# Patient Record
Sex: Female | Born: 1935 | Race: White | Hispanic: No | Smoking: Never smoker
Health system: Southern US, Community
[De-identification: ages and names within clinical notes are randomized; demographics above are authoritative.]

## PROBLEM LIST (undated history)

## (undated) DIAGNOSIS — F0390 Unspecified dementia without behavioral disturbance: Secondary | ICD-10-CM

## (undated) DIAGNOSIS — H3530 Unspecified macular degeneration: Secondary | ICD-10-CM

## (undated) DIAGNOSIS — F0280 Dementia in other diseases classified elsewhere without behavioral disturbance: Secondary | ICD-10-CM

## (undated) DIAGNOSIS — G309 Alzheimer's disease, unspecified: Secondary | ICD-10-CM

---

## 2013-07-10 ENCOUNTER — Ambulatory Visit: Payer: Self-pay

## 2014-09-27 ENCOUNTER — Encounter: Payer: Self-pay | Admitting: Emergency Medicine

## 2014-09-27 ENCOUNTER — Emergency Department
Admission: EM | Admit: 2014-09-27 | Discharge: 2014-09-27 | Disposition: A | Discharge status: Home / Self Care | Payer: Medicare Other | Attending: Emergency Medicine | Admitting: Emergency Medicine

## 2014-09-27 DIAGNOSIS — Z008 Encounter for other general examination: Secondary | ICD-10-CM | POA: Diagnosis present

## 2014-09-27 DIAGNOSIS — F0280 Dementia in other diseases classified elsewhere without behavioral disturbance: Secondary | ICD-10-CM | POA: Diagnosis not present

## 2014-09-27 DIAGNOSIS — G309 Alzheimer's disease, unspecified: Secondary | ICD-10-CM | POA: Insufficient documentation

## 2014-09-27 DIAGNOSIS — Z0000 Encounter for general adult medical examination without abnormal findings: Secondary | ICD-10-CM

## 2014-09-27 HISTORY — DX: Unspecified dementia, unspecified severity, without behavioral disturbance, psychotic disturbance, mood disturbance, and anxiety: F03.90

## 2014-09-27 MED ORDER — HYDROMORPHONE HCL 1 MG/ML IJ SOLN
INTRAMUSCULAR | Status: AC
  Filled 2014-09-27: qty 1
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
## 2014-09-27 NOTE — Discharge Instructions (Signed)
Medical Screening Exam A medical screening exam has been done. This exam helps find the cause of your problem and determines whether you need emergency treatment. Your exam has shown that you do not need emergency treatment at this point. It is safe for you to go to your caregiver's office or clinic for treatment. You should make an appointment today to see your caregiver as soon as he or she is available. Depending on your illness, your symptoms and condition can change over time. If your condition gets worse or you develop new or troubling symptoms before you see your caregiver, you should return to the emergency department for further evaluation.  Document Released: 02/08/2004 Document Revised: 03/25/2011 Document Reviewed: 09/19/2010 [facility] Patient Information 2015 [place], [place]. This information is not intended to replace advice given to you by your health care provider. Make sure you discuss any questions you have with your health care provider.  Suggest further follow-up with patient's primary care physician if needed. Her Lorazepam can also be used if the patient becomes agitated if needed

## 2014-09-27 NOTE — ED Notes (Signed)
Pt discharged to nursing home with written discharge instructions for facility; nad noted.

## 2015-05-29 ENCOUNTER — Encounter: Payer: Self-pay | Admitting: Emergency Medicine

## 2015-05-29 ENCOUNTER — Emergency Department: Payer: Medicare Other

## 2015-05-29 ENCOUNTER — Observation Stay: Payer: Medicare Other

## 2015-05-29 ENCOUNTER — Observation Stay
Admission: EM | Admit: 2015-05-29 | Discharge: 2015-05-30 | Payer: Medicare Other | Attending: Internal Medicine | Admitting: Internal Medicine

## 2015-05-29 DIAGNOSIS — G309 Alzheimer's disease, unspecified: Secondary | ICD-10-CM | POA: Diagnosis not present

## 2015-05-29 DIAGNOSIS — F809 Developmental disorder of speech and language, unspecified: Secondary | ICD-10-CM

## 2015-05-29 DIAGNOSIS — R4781 Slurred speech: Principal | ICD-10-CM | POA: Insufficient documentation

## 2015-05-29 DIAGNOSIS — F0390 Unspecified dementia without behavioral disturbance: Secondary | ICD-10-CM

## 2015-05-29 DIAGNOSIS — Z7982 Long term (current) use of aspirin: Secondary | ICD-10-CM | POA: Insufficient documentation

## 2015-05-29 DIAGNOSIS — I639 Cerebral infarction, unspecified: Secondary | ICD-10-CM

## 2015-05-29 DIAGNOSIS — Z66 Do not resuscitate: Secondary | ICD-10-CM | POA: Diagnosis not present

## 2015-05-29 DIAGNOSIS — R451 Restlessness and agitation: Secondary | ICD-10-CM | POA: Diagnosis not present

## 2015-05-29 DIAGNOSIS — Z8673 Personal history of transient ischemic attack (TIA), and cerebral infarction without residual deficits: Secondary | ICD-10-CM | POA: Insufficient documentation

## 2015-05-29 DIAGNOSIS — I4891 Unspecified atrial fibrillation: Secondary | ICD-10-CM

## 2015-05-29 DIAGNOSIS — R4701 Aphasia: Secondary | ICD-10-CM

## 2015-05-29 DIAGNOSIS — F0280 Dementia in other diseases classified elsewhere without behavioral disturbance: Secondary | ICD-10-CM | POA: Insufficient documentation

## 2015-05-29 DIAGNOSIS — H3530 Unspecified macular degeneration: Secondary | ICD-10-CM | POA: Diagnosis not present

## 2015-05-29 HISTORY — DX: Dementia in other diseases classified elsewhere, unspecified severity, without behavioral disturbance, psychotic disturbance, mood disturbance, and anxiety: F02.80

## 2015-05-29 HISTORY — DX: Alzheimer's disease, unspecified: G30.9

## 2015-05-29 HISTORY — DX: Unspecified macular degeneration: H35.30

## 2015-05-29 LAB — COMPREHENSIVE METABOLIC PANEL
ALT: 15 U/L (ref 14–54)
AST: 23 U/L (ref 15–41)
Albumin: 3.7 g/dL (ref 3.5–5.0)
Alkaline Phosphatase: 75 U/L (ref 38–126)
Anion gap: 5 (ref 5–15)
BILIRUBIN TOTAL: 0.6 mg/dL (ref 0.3–1.2)
BUN: 19 mg/dL (ref 6–20)
CO2: 31 mmol/L (ref 22–32)
Calcium: 8.8 mg/dL — ABNORMAL LOW (ref 8.9–10.3)
Chloride: 105 mmol/L (ref 101–111)
Creatinine, Ser: 0.82 mg/dL (ref 0.44–1.00)
GFR calc Af Amer: >60 mL/min (ref >60)
Glucose, Bld: 89 mg/dL (ref 65–99)
POTASSIUM: 4.4 mmol/L (ref 3.5–5.1)
Sodium: 141 mmol/L (ref 135–145)
TOTAL PROTEIN: 6.8 g/dL (ref 6.5–8.1)

## 2015-05-29 LAB — DIFFERENTIAL
BASOS PCT: 1 %
Basophils Absolute: 0 10*3/uL (ref 0–0.1)
EOS ABS: 0 10*3/uL (ref 0–0.7)
EOS PCT: 1 %
Lymphocytes Relative: 17 %
Lymphs Abs: 1.1 10*3/uL (ref 1.0–3.6)
MONO ABS: 0.5 10*3/uL (ref 0.2–0.9)
Monocytes Relative: 8 %
NEUTROS ABS: 4.5 10*3/uL (ref 1.4–6.5)
Neutrophils Relative %: 73 %

## 2015-05-29 LAB — URINALYSIS COMPLETE WITH MICROSCOPIC (ARMC ONLY)
BILIRUBIN URINE: NEGATIVE
Bacteria, UA: NONE SEEN
GLUCOSE, UA: NEGATIVE mg/dL
Hgb urine dipstick: NEGATIVE
KETONES UR: NEGATIVE mg/dL
Leukocytes, UA: NEGATIVE
Nitrite: NEGATIVE
Protein, ur: NEGATIVE mg/dL
SPECIFIC GRAVITY, URINE: 1.014 (ref 1.005–1.030)
Squamous Epithelial / LPF: NONE SEEN
pH: 7 (ref 5.0–8.0)

## 2015-05-29 LAB — TROPONIN I: Troponin I: <0.03 ng/mL (ref <0.031)

## 2015-05-29 LAB — CBC
HEMATOCRIT: 37 % (ref 35.0–47.0)
Hemoglobin: 12.1 g/dL (ref 12.0–16.0)
MCH: 27.5 pg (ref 26.0–34.0)
MCHC: 32.8 g/dL (ref 32.0–36.0)
MCV: 83.8 fL (ref 80.0–100.0)
Platelets: 193 10*3/uL (ref 150–440)
RBC: 4.41 MIL/uL (ref 3.80–5.20)
RDW: 15 % — AB (ref 11.5–14.5)
WBC: 6.2 10*3/uL (ref 3.6–11.0)

## 2015-05-29 LAB — APTT: aPTT: 26 seconds (ref 24–36)

## 2015-05-29 LAB — LIPID PANEL
Cholesterol: 180 mg/dL (ref 0–200)
HDL: 67 mg/dL (ref >40)
LDL CALC: 104 mg/dL — AB (ref 0–99)
Total CHOL/HDL Ratio: 2.7 RATIO
Triglycerides: 47 mg/dL (ref <150)
VLDL: 9 mg/dL (ref 0–40)

## 2015-05-29 LAB — PROTIME-INR
INR: 1.02
Prothrombin Time: 13.6 seconds (ref 11.4–15.0)

## 2015-05-29 MED ORDER — LORAZEPAM 2 MG/ML IJ SOLN
0.2500 mg | Freq: Once | INTRAMUSCULAR | Status: AC
  Administered 2015-05-29: 0.25 mg via INTRAVENOUS
  Filled 2015-05-29: qty 1

## 2015-05-29 MED ORDER — OCUVITE-LUTEIN PO CAPS
1.0000 | ORAL_CAPSULE | Freq: Every day | ORAL | Status: DC
  Filled 2015-05-29 (×2): qty 1

## 2015-05-29 MED ORDER — IPRATROPIUM BROMIDE 0.03 % NA SOLN
2.0000 | Freq: Three times a day (TID) | NASAL | Status: DC
Start: 2015-05-29 — End: 2015-05-30
  Filled 2015-05-29: qty 30

## 2015-05-29 MED ORDER — LORATADINE 10 MG PO TABS: 10.0000 mg | ORAL_TABLET | Freq: Every day | ORAL | Status: DC

## 2015-05-29 MED ORDER — LORAZEPAM 0.5 MG PO TABS: 0.2500 mg | ORAL_TABLET | Freq: Three times a day (TID) | ORAL | Status: DC | PRN

## 2015-05-29 MED ORDER — ATORVASTATIN CALCIUM 20 MG PO TABS: 40.0000 mg | ORAL_TABLET | Freq: Every day | ORAL | Status: DC

## 2015-05-29 MED ORDER — ASPIRIN 325 MG PO TABS: 325.0000 mg | ORAL_TABLET | Freq: Every day | ORAL | Status: DC

## 2015-05-29 MED ORDER — LORAZEPAM 0.5 MG PO TABS: 0.2500 mg | ORAL_TABLET | Freq: Once | ORAL | Status: DC

## 2015-05-29 MED ORDER — SODIUM CHLORIDE 0.9% FLUSH
3.0000 mL | Freq: Two times a day (BID) | INTRAVENOUS | Status: DC
Start: 2015-05-29 — End: 2015-05-30
  Administered 2015-05-29 – 2015-05-30 (×2): 3 mL via INTRAVENOUS

## 2015-05-29 MED ORDER — ASPIRIN 81 MG PO CHEW
81.0000 mg | CHEWABLE_TABLET | Freq: Once | ORAL | Status: DC
  Filled 2015-05-29: qty 1

## 2015-05-29 MED ORDER — DONEPEZIL HCL 5 MG PO TABS: 10.0000 mg | ORAL_TABLET | Freq: Every day | ORAL | Status: DC

## 2015-05-29 MED ORDER — LORAZEPAM 0.5 MG PO TABS
ORAL_TABLET | ORAL | Status: AC
  Filled 2015-05-29: qty 1

## 2015-05-29 MED ORDER — VITAMIN C 500 MG PO TABS: 500.0000 mg | ORAL_TABLET | Freq: Every day | ORAL | Status: DC

## 2015-05-29 MED ORDER — HEPARIN SODIUM (PORCINE) 5000 UNIT/ML IJ SOLN
5000.0000 [IU] | Freq: Three times a day (TID) | INTRAMUSCULAR | Status: DC
  Administered 2015-05-29 – 2015-05-30 (×2): 5000 [IU] via SUBCUTANEOUS
  Filled 2015-05-29 (×2): qty 1

## 2015-05-29 MED ORDER — BACITRACIN-NEOMYCIN-POLYMYXIN 400-5-5000 EX OINT
1.0000 "application " | TOPICAL_OINTMENT | Freq: Every day | CUTANEOUS | Status: DC
  Filled 2015-05-29 (×2): qty 1

## 2015-05-29 NOTE — H&P (Signed)
[employer] [facility] [place] [facility]   PATIENT NAME: Kathleen Chandler    MR#:  [id number]  DATE OF BIRTH:  30-Mar-1932  DATE OF ADMISSION:  05/29/2015  PRIMARY CARE PHYSICIAN: No primary care provider on file.   REQUESTING/REFERRING PHYSICIAN: Governor Rooks  CHIEF COMPLAINT:   Chief Complaint  Patient presents with  . Aphasia    HISTORY OF PRESENT ILLNESS: Kathleen Chandler  is a 82 y.o. female with a known history of dementia, NH resident- ahd slurred speech since last night-so sent here. Pt is completely confused, and walking in room and opening drawers, and checking all the storages, also walked in lobby. She is not able to communicate well, speaks few words. Moves all 4 limbs. I called daughter and left a msg, to get more info about the pt.  PAST MEDICAL HISTORY:   Past Medical History  Diagnosis Date  . Dementia   . Alzheimer's dementia   . Macular degeneration     PAST SURGICAL HISTORY: History reviewed. No pertinent past surgical history.  SOCIAL HISTORY:  Social History  Substance Use Topics  . Smoking status: Never Smoker   . Smokeless tobacco: Not on file  . Alcohol Use: No    FAMILY HISTORY:  Family History  Problem Relation Age of Onset  . Family history unknown: Yes   Pt is not able to give more details, and daughter did not answer the phone so far.  DRUG ALLERGIES: No Known Allergies  REVIEW OF SYSTEMS:   Pt have confusion.  MEDICATIONS AT HOME:  Prior to Admission medications   Medication Sig Start Date End Date Taking? Authorizing Provider  aspirin 325 MG tablet Take 325 mg by mouth daily.   Yes Historical Provider, MD  cetirizine (ZYRTEC) 10 MG tablet Take 10 mg by mouth daily as needed for allergies.   Yes Historical Provider, MD  donepezil (ARICEPT) 10 MG tablet Take 10 mg by mouth at bedtime.   Yes Historical Provider, MD  ipratropium (ATROVENT) 0.03 % nasal spray Place 2 sprays into both nostrils 3 (three) times daily.   Yes Historical  Provider, MD  LORazepam (ATIVAN) 0.5 MG tablet Take 0.25 mg by mouth every 8 (eight) hours as needed for anxiety.   Yes Historical Provider, MD  Multiple Vitamins-Minerals (ICAPS) TABS Take 1 tablet by mouth daily.   Yes Historical Provider, MD  neomycin-bacitracin-polymyxin (NEOSPORIN) [phone] ointment Apply 1 application topically daily. Pt applies to bilateral legs.   Yes Historical Provider, MD  vitamin C (ASCORBIC ACID) 500 MG tablet Take 500 mg by mouth daily.   Yes Historical Provider, MD      PHYSICAL EXAMINATION:   VITAL SIGNS: Blood pressure 150/89, pulse 100, temperature 97.7 F (36.5 C), temperature source Oral, resp. rate 24, weight 57.471 kg (126 lb 11.2 oz), SpO2 100 %.  GENERAL:  82 y.o.-year-old patient walking in room with no acute distress.  EYES: Pupils equal, round, reactive to light and accommodation. No scleral icterus. Extraocular muscles intact.  HEENT: Head atraumatic, normocephalic. Oropharynx and nasopharynx clear.  NECK:  Supple, no jugular venous distention. No thyroid enlargement, no tenderness.  LUNGS: Normal breath sounds bilaterally, no wheezing, rales,rhonchi or crepitation. No use of accessory muscles of respiration.  CARDIOVASCULAR: S1, S2 normal. No murmurs, rubs, or gallops.  ABDOMEN: Soft, nontender, nondistended. Bowel sounds present. No organomegaly or mass.  EXTREMITIES: No pedal edema, cyanosis, or clubbing.  NEUROLOGIC: Cranial nerves II through XII are intact. Muscle strength 5/5 in all extremities. Sensation  intact. Speech is with just few words only, and not clear. i am not sure about her baseline. PSYCHIATRIC: The patient is alert and not oriented .  SKIN: No obvious rash, lesion, or ulcer.   LABORATORY PANEL:   CBC  Recent Labs Lab 05/29/15 1013  WBC 6.2  HGB 12.1  HCT 37.0  PLT 193  MCV 83.8  MCH 27.5  MCHC 32.8  RDW 15.0*  LYMPHSABS 1.1  MONOABS 0.5  EOSABS 0.0  BASOSABS 0.0    ------------------------------------------------------------------------------------------------------------------  Chemistries   Recent Labs Lab 05/29/15 1013  NA 141  K 4.4  CL 105  CO2 31  GLUCOSE 89  BUN 19  CREATININE 0.82  CALCIUM 8.8*  AST 23  ALT 15  ALKPHOS 75  BILITOT 0.6   ------------------------------------------------------------------------------------------------------------------ CrCl cannot be calculated (Unknown ideal weight.). ------------------------------------------------------------------------------------------------------------------ No results for input(s): TSH, T4TOTAL, T3FREE, THYROIDAB in the last 72 hours.  Invalid input(s): FREET3   Coagulation profile  Recent Labs Lab 05/29/15 1013  INR 1.02   ------------------------------------------------------------------------------------------------------------------- No results for input(s): DDIMER in the last 72 hours. -------------------------------------------------------------------------------------------------------------------  Cardiac Enzymes  Recent Labs Lab 05/29/15 1013  TROPONINI <0.03   ------------------------------------------------------------------------------------------------------------------ Invalid input(s): POCBNP  ---------------------------------------------------------------------------------------------------------------  Urinalysis    Component Value Date/Time   COLORURINE YELLOW* 05/29/2015 1237   APPEARANCEUR CLEAR* 05/29/2015 1237   LABSPEC 1.014 05/29/2015 1237   PHURINE 7.0 05/29/2015 1237   GLUCOSEU NEGATIVE 05/29/2015 1237   HGBUR NEGATIVE 05/29/2015 1237   BILIRUBINUR NEGATIVE 05/29/2015 1237   KETONESUR NEGATIVE 05/29/2015 1237   PROTEINUR NEGATIVE 05/29/2015 1237   NITRITE NEGATIVE 05/29/2015 1237   LEUKOCYTESUR NEGATIVE 05/29/2015 1237     RADIOLOGY: Ct Head Wo Contrast  05/29/2015  CLINICAL DATA:  Slurred speech.  History of  dementia. EXAM: CT HEAD WITHOUT CONTRAST TECHNIQUE: Contiguous axial images were obtained from the base of the skull through the vertex without intravenous contrast. COMPARISON:  None. FINDINGS: There is a focal low-density along the inferior left temporal lobe suggestive for an old infarct. There is ex vacuo dilatation of the left temporal horn. Mild low density in the posterior periventricular white matter suggestive for chronic changes. Small low-density area in the left basal ganglia region could represent a dilated perivascular space versus old lacune. No evidence for acute hemorrhage, mass lesion, midline shift, hydrocephalus or large acute/subacute infarct. Wispy material in the sphenoid sinuses. No calvarial fracture. IMPRESSION: No acute intracranial abnormality. 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 Dilated perivascular space versus small lacune infarct of unknown age in the left basal ganglia region. Electronically Signed   By: Richarda Overlie M.D.   On: 05/29/2015 10:51    EKG: A fib, seems new.  IMPRESSION AND PLAN:  * Slurred speech   May be a stroke.   MRI, Carotid doppler, Echo.   Pt is walking in room, so may not need PT.   She is already a NH candidate duet o dementia.   Check Lipid and Hb A1c.  * New A fib   I will not give anticoagulant due to old age and dementia.   Cont asa. Check echo and serial troponin.  * Dementia   Cont home meds.   All the records are reviewed and case discussed with ED provider. Management plans discussed with the patient, family and they are in agreement.  CODE STATUS: full. Need to confirm with her daughter. Code Status History    This patient  does not have a recorded code status. Please follow your organizational policy for patients in this situation.       TOTAL TIME TAKING CARE OF THIS PATIENT: 45 minutes.    Altamese DillingVACHHANI, Shandrell Boda M.D on 05/29/2015   Between 7am  to 6pm - Pager - [phone]  After 6pm go to [email or web address] - Social research officer, governmentpassword [email or web address] [email or web address]  [facility]  Office  [phone]  CC: Primary care physician; No primary care provider on file.   Note: This dictation was prepared with Dragon dictation along with smaller phrase technology. Any transcriptional errors that result from this process are unintentional.

## 2015-05-29 NOTE — ED Notes (Signed)
Staff unable to execute ultrasound on patient. Patient refuses to lay on stretcher, uncooperative and agitated. Will make ordering MD aware.

## 2015-05-29 NOTE — ED Notes (Signed)
MD approved administration of aspirin PO despite inability to perform swallow screen.  Attempt to give ordered aspirin with apples sauce to patient was unsuccessful.  Pt is restless and easily agitated.

## 2015-05-29 NOTE — Care Management Obs Status (Signed)
MEDICARE OBSERVATION STATUS NOTIFICATION   Patient Details  Name: Kathleen Chandler MRN: [id number] Date of Birth: 07/23/1932   Medicare Observation Status Notification Given:  Yes   Explained notice to patient's daughter Steward DroneBrenda over the phone.  (patient being placed in observation to rule out for CVA). Initial work up is negative.  Primary nurse will five the [facility].  Copy placed in the medical record. Provided CM phone number for any questions regarding the notice.      Eber HongGreene, Jacquel Mccamish R, RN 05/29/2015, 5:09 PM

## 2015-05-29 NOTE — ED Notes (Signed)
1:1 sitter at bedside for safety.

## 2015-05-29 NOTE — ED Notes (Signed)
Sitter present with patient  

## 2015-05-29 NOTE — ED Notes (Signed)
[facility] 8pm last night. Pt with hx of dementia, confused upon arrival, tearful. Pt with slurred speech, no other obvious deficits noted.

## 2015-05-29 NOTE — ED Provider Notes (Signed)
[facility] Emergency Department Provider Note   ____________________________________________  Time seen: Approximately 11:30am I have reviewed the triage vital signs and the triage nursing note.  HISTORY  Chief Complaint Aphasia   Historian Limited - Dementia patient Nursing home report  HPI Kathleen Chandler is a 82 y.o. female with a history dementia and was sent here from the nursing home after reportedly being witnessed to have slurred speech last night. Reportedly the daughter from out of town requested the patient not be sent out last night, and it sounds like wanted the patient be evaluated this morning.  Per the daughter, her mother's dementia has been creating a lot of agitation type symptoms and she is on when necessary medications. It sounds like she is argumentative with the staff. Because of this, she had requested the patient go off of baby aspirin despite history of prior stroke about a month ago in order to help with easy skin bruising.     Past Medical History  Diagnosis Date  . Dementia   . Alzheimer's dementia   . Macular degeneration     There are no active problems to display for this patient.   History reviewed. No pertinent past surgical history.  No current outpatient prescriptions on file.  Allergies Review of patient's allergies indicates no known allergies.  No family history on file.  Social History Social History  Substance Use Topics  . Smoking status: Never Smoker   . Smokeless tobacco: None  . Alcohol Use: No    Review of Systems Limited poor historian, unreliable but these are her answers Constitutional:  Eyes:  ENT: . Cardiovascular: Negative for chest pain. Respiratory: Negative for shortness of breath. Gastrointestinal: Negative for abdominal pain, vomiting and diarrhea. Genitourinary: Negative for dysuria. Musculoskeletal: Negative for back pain. Skin: Negative for rash. Neurological: Negative  for headache. 10 point Review of Systems otherwise negative ____________________________________________   PHYSICAL EXAM:  VITAL SIGNS: ED Triage Vitals  Enc Vitals Group     BP 05/29/15 1002 150/89 mmHg     Pulse Rate 05/29/15 1002 100     Resp 05/29/15 1002 24     Temp 05/29/15 1002 97.7 F (36.5 C)     Temp Source 05/29/15 1002 Oral     SpO2 05/29/15 1002 100 %     Weight 05/29/15 1002 126 lb 11.2 oz (57.471 kg)     Height --      Head Cir --      Peak Flow --      Pain Score --      Pain Loc --      Pain Edu? --      Excl. in GC? --      Constitutional: Alert and Somewhat redirectable. No acute distress HEENT   Head: Normocephalic and atraumatic.      Eyes: Conjunctivae are normal. PERRL. Normal extraocular movements.      Ears:         Nose: No congestion/rhinnorhea.   Mouth/Throat: Mucous membranes are moist.   Neck: No stridor. Cardiovascular/Chest: Normal rate, regular rhythm.  No murmurs, rubs, or gallops. Respiratory: Normal respiratory effort without tachypnea nor retractions. Breath sounds are clear and equal bilaterally. No wheezes/rales/rhonchi. Gastrointestinal: Soft. No distention, no guarding, no rebound. Nontender.    Genitourinary/rectal:Deferred Musculoskeletal: Nontender with normal range of motion in all extremities. No joint effusions.  No lower extremity tenderness.  No edema. Neurologic:  No facial droop. No slurred speech. She does sometimes babble unintelligible  words, and answered to questions are not asked of her. At times though she does speak clearly her name and certain phrases. No extremity weakness or numbness. Steady gait. Skin:  Skin is warm, dry and intact. No rash noted. Psychiatric: Occasionally gets agitated, but is redirectable especially if she is allowed to stand and walk around the emergency department  ____________________________________________   EKG I, Governor Rooks, MD, the attending physician have personally  viewed and interpreted all ECGs.  81 bpm.  Undetermined rhythm, but appears to be atrial fibrillation.Narrow QRS normal axis. Nonspecific T-wave ____________________________________________  LABS (pertinent positives/negatives)  Urinalysis negative INR 1.02 CBC within normal limits Comprehensive metabolic panel within normal limits Troponin less than 0.03  ____________________________________________  RADIOLOGY All Xrays were viewed by me. Imaging interpreted by Radiologist.  CT head without contrast:   IMPRESSION: No acute intracranial abnormality.  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  Dilated perivascular space versus small lacune infarct of unknown age in the left basal ganglia region. __________________________________________  PROCEDURES  Procedure(s) performed: None  Critical Care performed: None  ____________________________________________   ED COURSE / ASSESSMENT AND PLAN  Pertinent labs & imaging results that were available during my care of the patient were reviewed by me and considered in my medical decision making (see chart for details).   Patient was sent here with a complaint of slurred speech, but when I examine her it doesn't seem somewhat slurred speech as either expressive aphasia or medication problems associated with dementia.  Her neurologic exam is without any additional focal neurologic findings.  The head CT showed evidence of the prior infarct, and then a small area that was questionably an age indeterminate lacunar infarct of the left basal ganglia versus dilated perivascular space.  I initially discussed with the daughter that even if this was a new stroke, confirm, we will probably just start her back on the aspirin which she had stopped.  However her EKG did show new onset atrial fibrillation, making her even more high risk for the new stroke. She does  warrant hospital observation, and workup for the possible new stroke in the setting of new onset A. fib. It is rate controlled. She was given aspirin here in emergent care.    CONSULTATIONS:   Hospitalist for admission.   Patient / Family / Caregiver informed of clinical course, medical decision-making process, and agree with plan.    ___________________________________________   FINAL CLINICAL IMPRESSION(S) / ED DIAGNOSES   Final diagnoses:  Problems with communication (including speech)  Dementia, without behavioral disturbance  Expressive aphasia  New onset atrial fibrillation (HCC)              Note: This dictation was prepared with Dragon dictation. Any transcriptional errors that result from this process are unintentional   Governor Rooks, MD 05/29/15 [phone]

## 2015-05-29 NOTE — ED Notes (Signed)
Unable to complete NIHSS due to patient has baseline severe dementia/alzheimer's.  Pt has garbled speech, but is comprehensible at times.  Nursing home rep states that baseline speech is "word salad, but comprehensible and clear".  Pt does not follow any commands willingly.

## 2015-05-29 NOTE — Care Management (Addendum)
[facility]  Facility staff noticed  unintelligible speech last pm which is not patient's baseline.  There is concern for CVA.  It is reported that patient's daughter was notified last pm of sx but it was decided not to send last night.  Patient baseline speech is clear and able to understand words- even though there is no relevant thought process with her words.  Her words today seem slurred and difficult to understand. ED physician has spoken with patient's daughter regarding discharging patient back to her facility. Later found to have what appears to be new atrial fib on EKG.  Patient is not symptomatic.  She was on aspirin but was stopped last week.  Patient to be placed in observation.  [facility]   There has been discussion of whether patient would be able to tolerate an MRI if ordered due to her dementia.  She is requiring a 1:1 sitter while in the ED. REviewed observation notice with daughter per phone and provided CM phone number to contact for questions.  Discussed with primary nurse of the need to address DNR status with patient's daughter when she arrives

## 2015-05-30 ENCOUNTER — Observation Stay
Admit: 2015-05-30 | Discharge: 2015-05-30 | Disposition: A | Discharge status: Home / Self Care | Payer: Medicare Other | Attending: Internal Medicine | Admitting: Internal Medicine

## 2015-05-30 LAB — BASIC METABOLIC PANEL
Anion gap: 5 (ref 5–15)
BUN: 18 mg/dL (ref 6–20)
CHLORIDE: 106 mmol/L (ref 101–111)
CO2: 29 mmol/L (ref 22–32)
CREATININE: 0.75 mg/dL (ref 0.44–1.00)
Calcium: 8.8 mg/dL — ABNORMAL LOW (ref 8.9–10.3)
GFR calc Af Amer: >60 mL/min (ref >60)
GFR calc non Af Amer: >60 mL/min (ref >60)
GLUCOSE: 88 mg/dL (ref 65–99)
POTASSIUM: 4.1 mmol/L (ref 3.5–5.1)
Sodium: 140 mmol/L (ref 135–145)

## 2015-05-30 LAB — CBC
HEMATOCRIT: 36 % (ref 35.0–47.0)
Hemoglobin: 11.5 g/dL — ABNORMAL LOW (ref 12.0–16.0)
MCH: 26.8 pg (ref 26.0–34.0)
MCHC: 32.1 g/dL (ref 32.0–36.0)
MCV: 83.7 fL (ref 80.0–100.0)
PLATELETS: 184 10*3/uL (ref 150–440)
RBC: 4.3 MIL/uL (ref 3.80–5.20)
RDW: 15.2 % — AB (ref 11.5–14.5)
WBC: 4.6 10*3/uL (ref 3.6–11.0)

## 2015-05-30 LAB — MRSA PCR SCREENING: MRSA by PCR: NEGATIVE

## 2015-05-30 LAB — TROPONIN I: Troponin I: <0.03 ng/mL (ref <0.031)

## 2015-05-30 LAB — ECHOCARDIOGRAM COMPLETE
HEIGHTINCHES: 63 in
WEIGHTICAEL: 1931.2 [oz_av]

## 2015-05-30 LAB — HEMOGLOBIN A1C: Hgb A1c MFr Bld: 6.2 % — ABNORMAL HIGH (ref 4.0–6.0)

## 2015-05-30 MED ORDER — METOPROLOL TARTRATE 5 MG/5ML IV SOLN
5.0000 mg | Freq: Once | INTRAVENOUS | Status: DC
  Filled 2015-05-30: qty 5

## 2015-05-30 MED ORDER — METOPROLOL TARTRATE 5 MG/5ML IV SOLN
5.0000 mg | Freq: Once | INTRAVENOUS | Status: AC
  Administered 2015-05-30: 5 mg via INTRAVENOUS
  Filled 2015-05-30: qty 5

## 2015-05-30 MED ORDER — HALOPERIDOL LACTATE 5 MG/ML IJ SOLN
2.0000 mg | Freq: Once | INTRAMUSCULAR | Status: AC
  Administered 2015-05-30: 10:00:00 2 mg via INTRAVENOUS
  Filled 2015-05-30: qty 1

## 2015-05-30 MED ORDER — ATORVASTATIN CALCIUM 40 MG PO TABS: 40.0000 mg | ORAL_TABLET | Freq: Every day | ORAL | Status: AC

## 2015-05-30 MED ORDER — LORAZEPAM 2 MG/ML IJ SOLN
1.0000 mg | Freq: Once | INTRAMUSCULAR | Status: AC
  Administered 2015-05-30: 1 mg via INTRAVENOUS
  Filled 2015-05-30: qty 1

## 2015-05-30 MED ORDER — HALOPERIDOL LACTATE 5 MG/ML IJ SOLN: 2.0000 mg | Freq: Four times a day (QID) | INTRAMUSCULAR | Status: DC | PRN

## 2015-05-30 NOTE — Discharge Instructions (Signed)
Soft diet W/f aspiration

## 2015-05-30 NOTE — Progress Notes (Signed)
Patient ID: Kathleen [facility], female   DOB: 10/21/1932, 82 y.o.   MRN: [id number] [facility] [place] [facility]   PATIENT NAME: Kathleen [facility]    MR#:  [id number]  DATE OF BIRTH:  02/02/1932  DATE OF ADMISSION:  05/29/2015 ADMITTING PHYSICIAN: Altamese DillingVaibhavkumar Vachhani, MD  DATE OF DISCHARGE: 05/30/15  PRIMARY CARE PHYSICIAN: No primary care provider on file.    ADMISSION DIAGNOSIS:  Problems with communication (including speech) [F80.9] CVA (cerebral infarction) [I63.9] New onset atrial fibrillation (HCC) [I48.91] Expressive aphasia [R47.01] Dementia, without behavioral disturbance [F03.90]  DISCHARGE DIAGNOSIS:  Slurred speech History of CVA in the past Agitation/restless S with tachycardia Advance progressive dementia  SECONDARY DIAGNOSIS:   Past Medical History  Diagnosis Date  . Dementia   . Alzheimer's dementia   . Macular degeneration     HOSPITAL COURSE:  Kathleen Chandler is a 82 y.o. female with a known history of dementia, NH resident- ahd slurred speech since last night-so sent here. Pt is completely confused, and walking in room and opening drawers, and checking all the storages, also walked in lobby. She is not able to communicate well, speaks few words.   * Slurred speech  May be a stroke. Unable to confirm diagnoses given and patient not able to sit still for MRI. She has advanced dementia and unable to assess her neurologically. She is moving all extremities well. No neuro deficit on that standpoint. She is able to swallow well.  Holding off aggressive workup with MRI, Carotid doppler, Echo. This was per patient's daughter's request since patient is very restless and agitated  We'll continue her aspirin  * New A fib  I will not give anticoagulant due to old age and dementia.  Cont asa. -This is due to her agitation and likely would be transient. Received IV metoprolol when necessary.  * Dementia with worsening agitation and  acute delirium  Cont home meds.  I had a long discussion with patient's daughter Kathleen [facility] who is the healthcare apartment today. Daughter understands patient's dementia is causing her to have agitation and appears more delirious and unable to do further workup. She is agreeable to not pursue any further workup for an stroke. We'll continue current medications which is aspirin. Daughter also understands patient is at risk of aspiration how were she will continue to monitor that at the facility and continue soft diet. Daughter is requesting to avoid further agitation in a strange environment for patient which is the hospital we'll discharge her back up [facility].    CONSULTS OBTAINED:     DRUG ALLERGIES:  No Known Allergies  DISCHARGE MEDICATIONS:   Current Discharge Medication List    CONTINUE these medications which have NOT CHANGED   Details  aspirin 325 MG tablet Take 325 mg by mouth daily.    cetirizine (ZYRTEC) 10 MG tablet Take 10 mg by mouth daily as needed for allergies.    donepezil (ARICEPT) 10 MG tablet Take 10 mg by mouth at bedtime.    ipratropium (ATROVENT) 0.03 % nasal spray Place 2 sprays into both nostrils 3 (three) times daily.    LORazepam (ATIVAN) 0.5 MG tablet Take 0.25 mg by mouth every 8 (eight) hours as needed for anxiety.    Multiple Vitamins-Minerals (ICAPS) TABS Take 1 tablet by mouth daily.    neomycin-bacitracin-polymyxin (NEOSPORIN) [phone] ointment Apply 1 application topically daily. Pt applies to bilateral legs.    vitamin C (ASCORBIC ACID) 500 MG tablet Take 500 mg by  mouth daily.        If you experience worsening of your admission symptoms, develop shortness of breath, life threatening emergency, suicidal or homicidal thoughts you must seek medical attention immediately by calling 911 or calling your MD immediately  if symptoms less severe.  You Must read complete instructions/literature along with all the possible adverse  reactions/side effects for all the Medicines you take and that have been prescribed to you. Take any new Medicines after you have completely understood and accept all the possible adverse reactions/side effects.   Please note  You were cared for by a hospitalist during your hospital stay. If you have any questions about your discharge medications or the care you received while you were in the hospital after you are discharged, you can call the unit and asked to speak with the hospitalist on call if the hospitalist that took care of you is not available. Once you are discharged, your primary care physician will handle any further medical issues. Please note that NO REFILLS for any discharge medications will be authorized once you are discharged, as it is imperative that you return to your primary care physician (or establish a relationship with a primary care physician if you do not have one) for your aftercare needs so that they can reassess your need for medications and monitor your lab values. Today   SUBJECTIVE   Patient appears irritable and agitated  VITAL SIGNS:  Blood pressure 170/131, pulse 111, temperature 97.3 F (36.3 C), temperature source Axillary, resp. rate 20, height  (1.6 m), weight 54.749 kg (120 lb 11.2 oz), SpO2 95 %.  I/O:  No intake or output data in the 24 hours ending 05/30/15 1252  PHYSICAL EXAMINATION:  GENERAL:  82 y.o.-year-old patient lying in the bed with no acute distress. Thin cachectic  EYES: Pupils equal, round, reactive to light and accommodation. No scleral icterus. Extraocular muscles intact.  HEENT: Head atraumatic, normocephalic. Oropharynx and nasopharynx clear.  NECK:  Supple, no jugular venous distention. No thyroid enlargement, no tenderness.  LUNGS: Normal breath sounds bilaterally, no wheezing, rales,rhonchi or crepitation. No use of accessory muscles of respiration.  CARDIOVASCULAR: S1, S2 normal. No murmurs, rubs, or gallops. Tachycardia   ABDOMEN: Soft, non-tender, non-distended. Bowel sounds present. No organomegaly or mass.  EXTREMITIES: No pedal edema, cyanosis, or clubbing.  NEUROLOGIC:Unable to assess neuro exam in detail. Patient is moving all her extremities well. Her speech is somewhat slurred however she does not verbalize much which gives me a chance to analyze her speechC: The patient is alert and oriented x 3.  SKIN: No obvious rash, lesion, or ulcer.   DATA REVIEW:   CBC   Recent Labs Lab 05/30/15 0126  WBC 4.6  HGB 11.5*  HCT 36.0  PLT 184    Chemistries   Recent Labs Lab 05/29/15 1013 05/30/15 0126  NA 141 140  K 4.4 4.1  CL 105 106  CO2 31 29  GLUCOSE 89 88  BUN 19 18  CREATININE 0.82 0.75  CALCIUM 8.8* 8.8*  AST 23  --   ALT 15  --   ALKPHOS 75  --   BILITOT 0.6  --     Microbiology Results   Recent Results (from the past 240 hour(s))  MRSA PCR Screening     Status: None   Collection Time: 05/29/15 10:36 PM  Result Value Ref Range Status   MRSA by PCR NEGATIVE NEGATIVE Final    Comment:  The GeneXpert MRSA Assay (FDA approved for NASAL specimens only), is one component of a comprehensive MRSA colonization surveillance program. It is not intended to diagnose MRSA infection nor to guide or monitor treatment for MRSA infections.     RADIOLOGY:  Ct Head Wo Contrast  05/29/2015  CLINICAL DATA:  Slurred speech.  History of dementia. EXAM: CT HEAD WITHOUT CONTRAST TECHNIQUE: Contiguous axial images were obtained from the base of the skull through the vertex without intravenous contrast. COMPARISON:  None. FINDINGS: There is a focal low-density along the inferior left temporal lobe suggestive for an old infarct. There is ex vacuo dilatation of the left temporal horn. Mild low density in the posterior periventricular white matter suggestive for chronic changes. Small low-density area in the left basal ganglia region could represent a dilated perivascular space versus old  lacune. No evidence for acute hemorrhage, mass lesion, midline shift, hydrocephalus or large acute/subacute infarct. Wispy material in the sphenoid sinuses. No calvarial fracture. IMPRESSION: No acute intracranial abnormality. 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 Dilated perivascular space versus small lacune infarct of unknown age in the left basal ganglia region. Electronically Signed   By: Richarda Overlie M.D.   On: 05/29/2015 10:51     Management plans discussed with the patient, family and they are in agreement.  CODE STATUS:     Code Status Orders        Start     Ordered   05/30/15 0916  Do not attempt resuscitation (DNR)   Continuous    Question Answer Comment  In the event of cardiac or respiratory ARREST Do not call a "code blue"   In the event of cardiac or respiratory ARREST Do not perform Intubation, CPR, defibrillation or ACLS   In the event of cardiac or respiratory ARREST Use medication by any route, position, wound care, and other measures to relive pain and suffering. May use oxygen, suction and manual treatment of airway obstruction as needed for comfort.      05/30/15 0915    Code Status History    Date Active Date Inactive Code Status Order ID Comments User Context   05/29/2015  7:06 PM 05/30/2015  9:15 AM Full Code [id number]  Altamese Dilling, MD Inpatient    Advance Directive Documentation        Most Recent Value   Type of Advance Directive  Healthcare Power of Attorney, Living will   Pre-existing out of facility DNR order (yellow form or pink MOST form)     "MOST" Form in Place?        TOTAL TIME TAKING CARE OF THIS PATIENT: 40 minutes.    Rohil Lesch M.D on 05/30/2015 at 12:52 PM  Between 7am to 6pm - Pager - [phone] After 6pm go to [email or web address] - password [email or web address] [facility]  [place]  Office  [phone]  CC: Primary care physician; No primary care provider on  file.

## 2015-05-30 NOTE — Clinical Social Work Note (Signed)
Clinical Social Work Assessment  Patient Details  Name: Kathleen [facility] MRN: [id number] Date of Birth: December 18, 1932  Date of referral:  05/30/15               Reason for consult:  Facility Placement, Other (Comment Required) (From [facility] )                Permission sought to share information with:  Chartered certified accountant granted to share information::  Yes, Verbal Permission Granted  Name::      Kathleen [facility]  Agency::     Relationship::     Contact Information:     Housing/Transportation Living arrangements for the past 2 months:  [place] of Information:  Facility, Adult Children, Power of Attorney Patient Interpreter Needed:  None Criminal Activity/Legal Involvement Pertinent to Current Situation/Hospitalization:  No - Comment as needed Significant Relationships:  Adult Children Lives with:  Facility Resident Do you feel safe going back to the place where you live?  Yes Need for family participation in patient care:  Yes (Comment)  Care giving concerns:  Patient is a resident at [facility] (fax: [phone])    Social Worker assessment / plan:  Clinical Social Worker (CSW) received consult that patient is from [place] [facility]. CSW contacted Resident [place] at [facility]. Per [place] patient has been at [facility] since 02/26/13 and is private pay. Per [place] patient is private pay and walks without an assistive device at baseline. Per [place] patient is not on oxygen and has discharged from home health services. Per [place] patient can return to [facility] when stable. CSW met with patient and her daughter Kathleen Chandler [phone] was at bedside. Per daughter she is HPOA and is agreeable for patient to return to [facility].   FL2 complete. CSW will continue to follow and assist as needed.   Employment status:  Disabled (Comment on whether or not currently receiving  Disability), Retired Forensic scientist:    PT Recommendations:  Not assessed at this time Information / Referral to community resources:  Other (Comment Required) (Patient will return to [facility] )  Patient/Family's Response to care:  Patient's daughter Kathleen [facility] is agreeable for patient to return to [facility].   Patient/Family's Understanding of and Emotional Response to Diagnosis, Current Treatment, and Prognosis:  Patient's daughter was pleasant and thanked CSW for visit.   Emotional Assessment Appearance:  Appears stated age Attitude/Demeanor/Rapport:  Unable to Assess Affect (typically observed):  Unable to Assess Orientation:  Oriented to Self, Fluctuating Orientation (Suspected and/or reported Sundowners) Alcohol / Substance use:  Not Applicable Psych involvement (Current and /or in the community):  No (Comment)  Discharge Needs  Concerns to be addressed:  Discharge Planning Concerns Readmission within the last 30 days:  No Current discharge risk:  Chronically ill, Cognitively Impaired Barriers to Discharge:  Continued Medical Work up   Loralyn Freshwater, LCSW 05/30/2015, 10:47 AM

## 2015-05-30 NOTE — Progress Notes (Signed)
*  PRELIMINARY RESULTS* Echocardiogram 2D Echocardiogram has been performed.  Georgann HousekeeperJerry R Hege 05/30/2015, 8:20 AM

## 2015-05-30 NOTE — Progress Notes (Signed)
[facility]. Discharge packet made by social worker given to patient daughter Steward DroneBrenda who will be transporting patient.

## 2015-05-30 NOTE — Discharge Summary (Signed)
[facility] [place] [facility]   PATIENT NAME: Kathleen CharlestonReba [facility]    MR#:  [id number]  DATE OF BIRTH:  06/30/1932  DATE OF ADMISSION:  05/29/2015 ADMITTING PHYSICIAN: Altamese DillingVaibhavkumar Vachhani, MD  DATE OF DISCHARGE: 05/30/15  PRIMARY CARE PHYSICIAN: No primary care provider on file.    ADMISSION DIAGNOSIS:  Problems with communication (including speech) [F80.9] CVA (cerebral infarction) [I63.9] New onset atrial fibrillation (HCC) [I48.91] Expressive aphasia [R47.01] Dementia, without behavioral disturbance [F03.90]  DISCHARGE DIAGNOSIS:  Slurred speech History of CVA in the past Agitation/restless S with tachycardia Advance progressive dementia  SECONDARY DIAGNOSIS:   Past Medical History  Diagnosis Date  . Dementia   . Alzheimer's dementia   . Macular degeneration     HOSPITAL COURSE:  Kathleen CharlestonReba Escue is a 82 y.o. female with a known history of dementia, NH resident- ahd slurred speech since last night-so sent here. Pt is completely confused, and walking in room and opening drawers, and checking all the storages, also walked in lobby. She is not able to communicate well, speaks few words.  * Slurred speech  May be a stroke. Unable to confirm diagnoses given and patient not able to sit still for MRI. She has advanced dementia and unable to assess her neurologically. She is moving all extremities well. No neuro deficit on that standpoint. She is able to swallow well.  Holding off aggressive workup with MRI, Carotid doppler, Echo. This was per patient's daughter's request since patient is very restless and agitated  We'll continue her aspirin  * New A fib  I will not give anticoagulant due to old age and dementia.  Cont asa. -This is due to her agitation and likely would be transient. Received IV metoprolol when necessary.  * Dementia with worsening agitation and acute delirium  Cont home meds.  I had a long discussion with patient's daughter  Steward DroneBrenda who is the healthcare apartment today. Daughter understands patient's dementia is causing her to have agitation and appears more delirious and unable to do further workup. She is agreeable to not pursue any further workup for an stroke. We'll continue current medications which is aspirin. Daughter also understands patient is at risk of aspiration how were she will continue to monitor that at the facility and continue soft diet. Daughter is requesting to avoid further agitation in a strange environment for patient which is the hospital we'll discharge her back up [facility].  CONSULTS OBTAINED:     DRUG ALLERGIES:  No Known Allergies  DISCHARGE MEDICATIONS:   Current Discharge Medication List    START taking these medications   Details  atorvastatin (LIPITOR) 40 MG tablet Take 1 tablet (40 mg total) by mouth daily at 6 PM. Qty: 30 tablet, Refills: 0      CONTINUE these medications which have NOT CHANGED   Details  aspirin 325 MG tablet Take 325 mg by mouth daily.    cetirizine (ZYRTEC) 10 MG tablet Take 10 mg by mouth daily as needed for allergies.    donepezil (ARICEPT) 10 MG tablet Take 10 mg by mouth at bedtime.    ipratropium (ATROVENT) 0.03 % nasal spray Place 2 sprays into both nostrils 3 (three) times daily.    LORazepam (ATIVAN) 0.5 MG tablet Take 0.25 mg by mouth every 8 (eight) hours as needed for anxiety.    Multiple Vitamins-Minerals (ICAPS) TABS Take 1 tablet by mouth daily.    neomycin-bacitracin-polymyxin (NEOSPORIN) [phone] ointment Apply 1 application topically daily. Pt applies to  bilateral legs.    vitamin C (ASCORBIC ACID) 500 MG tablet Take 500 mg by mouth daily.        If you experience worsening of your admission symptoms, develop shortness of breath, life threatening emergency, suicidal or homicidal thoughts you must seek medical attention immediately by calling 911 or calling your MD immediately  if symptoms less severe.  You Must read  complete instructions/literature along with all the possible adverse reactions/side effects for all the Medicines you take and that have been prescribed to you. Take any new Medicines after you have completely understood and accept all the possible adverse reactions/side effects.   Please note  You were cared for by a hospitalist during your hospital stay. If you have any questions about your discharge medications or the care you received while you were in the hospital after you are discharged, you can call the unit and asked to speak with the hospitalist on call if the hospitalist that took care of you is not available. Once you are discharged, your primary care physician will handle any further medical issues. Please note that NO REFILLS for any discharge medications will be authorized once you are discharged, as it is imperative that you return to your primary care physician (or establish a relationship with a primary care physician if you do not have one) for your aftercare needs so that they can reassess your need for medications and monitor your lab values.   DATA REVIEW:   CBC   Recent Labs Lab 05/30/15 0126  WBC 4.6  HGB 11.5*  HCT 36.0  PLT 184    Chemistries   Recent Labs Lab 05/29/15 1013 05/30/15 0126  NA 141 140  K 4.4 4.1  CL 105 106  CO2 31 29  GLUCOSE 89 88  BUN 19 18  CREATININE 0.82 0.75  CALCIUM 8.8* 8.8*  AST 23  --   ALT 15  --   ALKPHOS 75  --   BILITOT 0.6  --     Microbiology Results   Recent Results (from the past 240 hour(s))  MRSA PCR Screening     Status: None   Collection Time: 05/29/15 10:36 PM  Result Value Ref Range Status   MRSA by PCR NEGATIVE NEGATIVE Final    Comment:        The GeneXpert MRSA Assay (FDA approved for NASAL specimens only), is one component of a comprehensive MRSA colonization surveillance program. It is not intended to diagnose MRSA infection nor to guide or monitor treatment for MRSA infections.      RADIOLOGY:  Ct Head Wo Contrast  05/29/2015  CLINICAL DATA:  Slurred speech.  History of dementia. EXAM: CT HEAD WITHOUT CONTRAST TECHNIQUE: Contiguous axial images were obtained from the base of the skull through the vertex without intravenous contrast. COMPARISON:  None. FINDINGS: There is a focal low-density along the inferior left temporal lobe suggestive for an old infarct. There is ex vacuo dilatation of the left temporal horn. Mild low density in the posterior periventricular white matter suggestive for chronic changes. Small low-density area in the left basal ganglia region could represent a dilated perivascular space versus old lacune. No evidence for acute hemorrhage, mass lesion, midline shift, hydrocephalus or large acute/subacute infarct. Wispy material in the sphenoid sinuses. No calvarial fracture. IMPRESSION: No acute intracranial abnormality. 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 Dilated perivascular space versus small lacune infarct of unknown age in  the left basal ganglia region. Electronically Signed   By: Richarda Overlie M.D.   On: 05/29/2015 10:51     Management plans discussed with the patient, family and they are in agreement.  CODE STATUS:     Code Status Orders        Start     Ordered   05/30/15 0916  Do not attempt resuscitation (DNR)   Continuous    Question Answer Comment  In the event of cardiac or respiratory ARREST Do not call a "code blue"   In the event of cardiac or respiratory ARREST Do not perform Intubation, CPR, defibrillation or ACLS   In the event of cardiac or respiratory ARREST Use medication by any route, position, wound care, and other measures to relive pain and suffering. May use oxygen, suction and manual treatment of airway obstruction as needed for comfort.      05/30/15 0915    Code Status History    Date Active Date Inactive Code Status Order ID Comments User Context    05/29/2015  7:06 PM 05/30/2015  9:15 AM Full Code [id number]  Altamese Dilling, MD Inpatient    Advance Directive Documentation        Most Recent Value   Type of Advance Directive  Healthcare Power of Attorney, Living will   Pre-existing out of facility DNR order (yellow form or pink MOST form)     "MOST" Form in Place?        TOTAL TIME TAKING CARE OF THIS PATIENT: 40 minutes.    Londell Noll M.D on 05/30/2015 at 12:58 PM  Between 7am to 6pm - Pager - [phone] After 6pm go to [email or web address] - password [email or web address] [facility]  [place]  Office  [phone]  CC: Primary care physician; No primary care provider on file.

## 2015-05-30 NOTE — Progress Notes (Signed)
Patient is medically stable [facility] Per [place] Resident Care Coordinator [facility]. Clinical Child psychotherapistocial Worker (CSW) sent D/C Summary and FL2 to [place]. Patient's daughter Steward DroneBrenda is at bedside and reported that she will transport patient [facility]. RN aware of above. Please reconsult if future social work needs arise. CSW signing off.   Jetta LoutBailey Morgan, LCSW [phone]

## 2015-05-30 NOTE — Care Management (Signed)
[facility]. RNCM will follow along with CSW.

## 2015-05-30 NOTE — Progress Notes (Signed)
Patient ID: [name], female   DOB: 07/19/1932, 82 y.o.   MRN: [id number]  Discussed at length with dter Steward DroneBrenda covet Pt's dementia is advancing and overall she is declining. dter agreeable for no aggressive w/u and would like pt to get soft diet Will d/c MRI Code status addressed pt is DNR.   Pt is agitated,restless in the new environment. Low dose ativan as needed. Will d/c back to [facility] later today if she remains stable dter agreeable

## 2015-05-30 NOTE — Progress Notes (Signed)
Speech Therapy Note: received order, reviewed chart notes. Consulted MD/NSG re: pt's status. MD has ordered a soft diet for pt stating it was for her "pleasure" w/ NSG to monitor if she was unable to fully chew and clear it. Attempted to see pt for BSE, however, she was agitated and labile and could not be encouraged to take any po trials. Family member requested medication for agitation; NSG informed. ST will f/u w/ pt at a meal later today if possible. Educated briefly on general aspiration precautions w/ NSG staff. NSG updated and agreed.

## 2015-05-30 NOTE — [place] FL2 (Signed)
  [place] MEDICAID FL2 LEVEL OF CARE SCREENING TOOL     IDENTIFICATION  Patient Name: Kathleen Chandler Birthdate: 08/29/1932 Sex: female Admission Date (Current Location): 05/29/2015  [facility] [place]  ChiropodistAlamChristianne Borrowance   Facility and Address:  [facility], [address], [place], [place] [address]      Provider Number: [id number]  Attending Physician Name and Address:  Enedina FinnerSona Patel, MD  Relative Name and Phone Number:       Current Level of Care: Hospital Recommended Level of Care: Assisted Living Facility, Memory Care Prior Approval Number:    Date Approved/Denied:   PASRR Number:    Discharge Plan: Domiciliary (Rest home)    Current Diagnoses: Primary: Dementia  Patient Active Problem List   Diagnosis Date Noted  . Slurred speech 05/29/2015  . CVA (cerebral infarction) 05/29/2015    Orientation RESPIRATION BLADDER Height & Weight     Self  Normal Continent Weight: 120 lb 11.2 oz (54.749 kg) Height:  5\' 3"  (160 cm)  BEHAVIORAL SYMPTOMS/MOOD NEUROLOGICAL BOWEL NUTRITION STATUS   (none )  (none ) Continent Diet: Soft   AMBULATORY STATUS COMMUNICATION OF NEEDS Skin   Independent Verbally Normal                       Personal Care Assistance Level of Assistance  Bathing, Feeding, Dressing Bathing Assistance: Independent Feeding assistance: Independent Dressing Assistance: Independent     Functional Limitations Info  Sight, Hearing, Speech Sight Info: Adequate Hearing Info: Adequate Speech Info: Impaired    SPECIAL CARE FACTORS FREQUENCY                       Contractures      Additional Factors Info  Code Status, Allergies, Psychotropic DNR  Allergies Info:  (No Known Allergies ) Psychotropic Info:  (Ativan )        Discharge Medications: Please see discharge summary for a list of discharge medications. Current Discharge Medication List    START taking these medications   Details  atorvastatin  (LIPITOR) 40 MG tablet Take 1 tablet (40 mg total) by mouth daily at 6 PM. Qty: 30 tablet, Refills: 0      CONTINUE these medications which have NOT CHANGED   Details  aspirin 325 MG tablet Take 325 mg by mouth daily.    cetirizine (ZYRTEC) 10 MG tablet Take 10 mg by mouth daily as needed for allergies.    donepezil (ARICEPT) 10 MG tablet Take 10 mg by mouth at bedtime.    ipratropium (ATROVENT) 0.03 % nasal spray Place 2 sprays into both nostrils 3 (three) times daily.    LORazepam (ATIVAN) 0.5 MG tablet Take 0.25 mg by mouth every 8 (eight) hours as needed for anxiety.    Multiple Vitamins-Minerals (ICAPS) TABS Take 1 tablet by mouth daily.    neomycin-bacitracin-polymyxin (NEOSPORIN) [phone] ointment Apply 1 application topically daily. Pt applies to bilateral legs.    vitamin C (ASCORBIC ACID) 500 MG tablet Take 500 mg by mouth daily.             Relevant Imaging Results:  Relevant Lab Results:   Additional Information  (SSN: [id number])  Haig ProphetMorgan, Kosta Schnitzler G, LCSW

## 2015-05-30 NOTE — Plan of Care (Signed)
Problem: Pain Managment: Goal: General experience of comfort will improve Outcome: Progressing No complaints of pain. Pt not alert enough to do NIH swallow screen, Made NPO and speech eval ordered per MD.

## 2015-05-30 NOTE — [place] FL2 (Signed)
MEDICAID FL2 LEVEL OF CARE SCREENING TOOL     IDENTIFICATION  Patient Name: Kathleen Chandler Birthdate: May 07, 1932 Sex: female Admission Date (Current Location): 05/29/2015  [facility] and [place] Number:  Chiropodist and Address:  [facility], [address], [place], [place] [address]      Provider Number: [id number]  Attending Physician Name and Address:  Enedina Finner, MD  Relative Name and Phone Number:       Current Level of Care: Hospital Recommended Level of Care: Assisted Living Facility, Memory Care Prior Approval Number:    Date Approved/Denied:   PASRR Number:    Discharge Plan: Domiciliary (Rest home)    Current Diagnoses: Primary: Dementia  Patient Active Problem List   Diagnosis Date Noted  . Slurred speech 05/29/2015  . CVA (cerebral infarction) 05/29/2015    Dementia   . Alzheimer's dementia   . Macular degeneration          Orientation RESPIRATION BLADDER Height & Weight     Self  Normal Continent Weight: 120 lb 11.2 oz (54.749 kg) Height:   (160 cm)  BEHAVIORAL SYMPTOMS/MOOD NEUROLOGICAL BOWEL NUTRITION STATUS   (none )  (none ) Continent Diet (SLP to evaluate )  AMBULATORY STATUS COMMUNICATION OF NEEDS Skin   Independent Verbally Normal                       Personal Care Assistance Level of Assistance  Bathing, Feeding, Dressing Bathing Assistance: Independent Feeding assistance: Independent Dressing Assistance: Independent     Functional Limitations Info  Sight, Hearing, Speech Sight Info: Adequate Hearing Info: Adequate Speech Info: Impaired    SPECIAL CARE FACTORS FREQUENCY                       Contractures      Additional Factors Info  Code Status, Allergies, Psychotropic Code Status Info:  (Full Code. ) Allergies Info:  (No Known Allergies ) Psychotropic Info:  (Ativan )         Current Medications (05/30/2015):  This is the current hospital  active medication list Current Facility-Administered Medications  Medication Dose Route Frequency Provider Last Rate Last Dose  . aspirin chewable tablet 81 mg  81 mg Oral Once Governor Rooks, MD   81 mg at 05/29/15 1525  . aspirin tablet 325 mg  325 mg Oral Daily Altamese Dilling, MD   325 mg at 05/29/15 2212  . atorvastatin (LIPITOR) tablet 40 mg  40 mg Oral q1800 Altamese Dilling, MD   40 mg at 05/29/15 1838  . donepezil (ARICEPT) tablet 10 mg  10 mg Oral QHS Altamese Dilling, MD   10 mg at 05/29/15 2212  . heparin injection 5,000 Units  5,000 Units Subcutaneous Q8H Altamese Dilling, MD   5,000 Units at 05/30/15 0601  . ipratropium (ATROVENT) 0.03 % nasal spray 2 spray  2 spray Each Nare TID Altamese Dilling, MD   2 spray at 05/29/15 2236  . loratadine (CLARITIN) tablet 10 mg  10 mg Oral Daily Altamese Dilling, MD   10 mg at 05/29/15 2211  . LORazepam (ATIVAN) tablet 0.25 mg  0.25 mg Oral Once Governor Rooks, MD   0 mg at 05/29/15 1201  . LORazepam (ATIVAN) tablet 0.25 mg  0.25 mg Oral Q8H PRN Altamese Dilling, MD      . metoprolol (LOPRESSOR) injection 5 mg  5 mg Intravenous Once Ihor Austin, MD   5  mg at 05/30/15 0702  . multivitamin-lutein (OCUVITE-LUTEIN) capsule 1 capsule  1 capsule Oral Daily Altamese DillingVaibhavkumar Vachhani, MD   1 capsule at 05/29/15 2211  . neomycin-bacitracin-polymyxin (NEOSPORIN) ointment 1 application  1 application Topical Daily Altamese DillingVaibhavkumar Vachhani, MD   1 application at 05/29/15 2211  . sodium chloride flush (NS) 0.9 % injection 3 mL  3 mL Intravenous Q12H Altamese DillingVaibhavkumar Vachhani, MD   3 mL at 05/29/15 2252  . vitamin C (ASCORBIC ACID) tablet 500 mg  500 mg Oral Daily Altamese DillingVaibhavkumar Vachhani, MD   500 mg at 05/29/15 2211     Discharge Medications: Please see discharge summary for a list of discharge medications.  Relevant Imaging Results:  Relevant Lab Results:   Additional Information  (SSN: [id number])  Haig ProphetMorgan, Hunner Garcon G,  LCSW

## 2015-05-30 NOTE — Progress Notes (Addendum)
Patient HR 157-160, MD notified, pt very agitated, I mg IV ativan ordered per MD, metoprolol 5mg  IV to be given if ativan not effective.

## 2016-07-05 ENCOUNTER — Encounter: Payer: Self-pay | Admitting: Emergency Medicine

## 2016-07-05 ENCOUNTER — Emergency Department
Admission: EM | Admit: 2016-07-05 | Discharge: 2016-07-05 | Disposition: A | Discharge status: Home / Self Care | Payer: Medicare Other | Attending: Emergency Medicine | Admitting: Emergency Medicine

## 2016-07-05 DIAGNOSIS — Z79899 Other long term (current) drug therapy: Secondary | ICD-10-CM | POA: Diagnosis not present

## 2016-07-05 DIAGNOSIS — Z7982 Long term (current) use of aspirin: Secondary | ICD-10-CM | POA: Insufficient documentation

## 2016-07-05 DIAGNOSIS — Y999 Unspecified external cause status: Secondary | ICD-10-CM | POA: Diagnosis not present

## 2016-07-05 DIAGNOSIS — S61212A Laceration without foreign body of right middle finger without damage to nail, initial encounter: Secondary | ICD-10-CM | POA: Diagnosis present

## 2016-07-05 DIAGNOSIS — G309 Alzheimer's disease, unspecified: Secondary | ICD-10-CM | POA: Diagnosis not present

## 2016-07-05 DIAGNOSIS — Y939 Activity, unspecified: Secondary | ICD-10-CM | POA: Insufficient documentation

## 2016-07-05 DIAGNOSIS — W260XXA Contact with knife, initial encounter: Secondary | ICD-10-CM | POA: Diagnosis not present

## 2016-07-05 DIAGNOSIS — Y92120 Kitchen in nursing home as the place of occurrence of the external cause: Secondary | ICD-10-CM | POA: Diagnosis not present

## 2016-07-05 LAB — GLUCOSE, CAPILLARY: GLUCOSE-CAPILLARY: 96 mg/dL (ref 65–99)

## 2016-07-05 MED ORDER — CEPHALEXIN 250 MG PO CAPS: 250.0000 mg | ORAL_CAPSULE | Freq: Four times a day (QID) | ORAL | 0 refills | Status: AC

## 2016-07-05 MED ORDER — ALPRAZOLAM 0.5 MG PO TABS
0.5000 mg | ORAL_TABLET | ORAL | Status: AC
  Administered 2016-07-05: 0.5 mg via ORAL
  Filled 2016-07-05: qty 1

## 2016-07-05 NOTE — ED Notes (Signed)
Laceration to the right middle finger bleeding controlled at this time.  CBG = 96

## 2016-07-05 NOTE — Discharge Instructions (Signed)
You have been seen in the Emergency Department (ED) today for a laceration (cut).  Please keep the cut clean but do not submerge it in the water.  It has been repaired with glue.  Please follow up patient's doctor as soon as possible regarding today's emergent visit.   Return to the ED or call your doctor if you notice any signs of infection such as fever, increased pain, increased redness, pus, or other symptoms that concern you.

## 2016-07-05 NOTE — ED Notes (Signed)
EMS arrived to [facility],

## 2016-07-05 NOTE — ED Provider Notes (Signed)
[facility] Emergency Department Provider Note   ____________________________________________   First MD Initiated Contact with Patient 07/05/16 [phone]     (approximate)  I have reviewed the triage vital signs and the nursing notes.   HISTORY  Chief Complaint Laceration  EM caveat: Limited by patient dementia she is reportedly severe to the point she no longer recognizes her own family  HPI Kathleen Chandler is a 83 y.o. female here for evaluation after staff at [facility] took a butter knife from her, she was in the kitchen area according to EMS and when the knife was removed it caused a cut on her right middle finger. No other injury.  Spoke with the patient's daughter, she reports that her mother's severe dementia with behavioral problems. She occasionally requires Xanax to help calm her. They've been working through a lot of behavioral problems related to her dementia. She is not aware of any recent illness or changes in her health other than slow worsening of her dementia  Patient does not recall event. Denies wine a harm anyone.  Patient denies that the finger hurts  Past Medical History:  Diagnosis Date  . Alzheimer's dementia   . Dementia   . Macular degeneration     Patient Active Problem List   Diagnosis Date Noted  . Slurred speech 05/29/2015  . CVA (cerebral infarction) 05/29/2015    History reviewed. No pertinent surgical history.  Prior to Admission medications   Medication Sig Start Date End Date Taking? Authorizing Provider  aspirin 325 MG tablet Take 325 mg by mouth daily.    [provider]  atorvastatin (LIPITOR) 40 MG tablet Take 1 tablet (40 mg total) by mouth daily at 6 PM. 05/30/15   Enedina Finner, MD  cephALEXin (KEFLEX) 250 MG capsule Take 1 capsule (250 mg total) by mouth 4 (four) times daily. 07/05/16 07/15/16  Sharyn Creamer, MD  cetirizine (ZYRTEC) 10 MG tablet Take 10 mg by mouth daily as needed for allergies.     [provider]  donepezil (ARICEPT) 10 MG tablet Take 10 mg by mouth at bedtime.    [provider]  ipratropium (ATROVENT) 0.03 % nasal spray Place 2 sprays into both nostrils 3 (three) times daily.    [provider]  LORazepam (ATIVAN) 0.5 MG tablet Take 0.25 mg by mouth every 8 (eight) hours as needed for anxiety.    [provider]  Multiple Vitamins-Minerals (ICAPS) TABS Take 1 tablet by mouth daily.    [provider]  neomycin-bacitracin-polymyxin (NEOSPORIN) [phone] ointment Apply 1 application topically daily. Pt applies to bilateral legs.    [provider]  vitamin C (ASCORBIC ACID) 500 MG tablet Take 500 mg by mouth daily.    [provider]    Allergies Patient has no known allergies.  Family History  Problem Relation Age of Onset  . Family history unknown: Yes    Social History Social History  Substance Use Topics  . Smoking status: Never Smoker  . Smokeless tobacco: Not on file  . Alcohol use No    Review of Systems  EM caveat    ____________________________________________   PHYSICAL EXAM:  VITAL SIGNS: ED Triage Vitals [07/05/16 0904]  Enc Vitals Group     BP      Pulse      Resp      Temp      Temp src      SpO2 97 %     Weight  Height      Head Circumference      Peak Flow      Pain Score      Pain Loc      Pain Edu?      Excl. in GC?     Constitutional: Alert and disoriented. She is calm, becomes upset with checking her blood pressure with a blood pressure cuff reporting it hurts. She becomes agitated when approached by staff, but calm's and rests comfortably when not approached or when calmly approached and examined by myself. Eyes: Conjunctivae are normal. Head: Atraumatic. Nose: No congestion/rhinnorhea. Mouth/Throat: Mucous membranes are moist. Neck: No stridor.   Cardiovascular: Normal rate, regular rhythm. Grossly normal heart sounds.  Good peripheral  circulation. Respiratory: Normal respiratory effort.  No retractions. Lungs CTAB. Musculoskeletal: No lower extremity tenderness nor edema. RIGHT  Right upper extremity demonstrates normal strength, good use of all muscles. No edema bruising or contusions of the right shoulder/upper arm, right elbow, right forearm / hand. Full range of motion of the right right upper extremity without pain. No evidence of trauma except for a small very superficial about half centimeter laceration over the pad portion of the right middle phalanx of the right middle finger. Strong radial pulse. Intact median/ulnar/radial neuro-muscular exam though some limitations due to dementia.  LEFT Left upper extremity demonstrates normal strength, good use of all muscles. No edema bruising or contusions of the left shoulder/upper arm, left elbow, left forearm / hand. Full range of motion of the left  upper extremity without pain. No evidence of trauma. Strong radial pulse. Intact median/ulnar/radial neuro-muscular exam.  Neurologic:  Normal speech and language. No gross focal neurologic deficits are appreciated.  Skin:  Skin is warm, dry and intact. No rash noted. Psychiatric: Mood and affect are normal. Speech and behavior are normal.  ____________________________________________   LABS (all labs ordered are listed, but only abnormal results are displayed)  Labs Reviewed  GLUCOSE, CAPILLARY  CBG MONITORING, ED   ____________________________________________  EKG   ____________________________________________  RADIOLOGY   ____________________________________________   PROCEDURES  Procedure(s) performed:   LACERATION REPAIR Performed by: Sharyn CreamerQUALE, Landyn Buckalew Authorized by: Sharyn CreamerQUALE, Naveed Humphres Consent: Verbal consent obtained. Risks and benefits: risks, benefits and alternatives were implied as patient can not consent and clearly needs wound closure. Discussed plan with daughter who is in agrement. Consent given by:  Daughter Patient identity confirmed: provided demographic data Wound explored  Laceration Location:   Laceration Length: 1.5 cm Irrigation method: syringe Amount of cleaning: standard  Skin closure: tissue adhesive glue   Number of sutures: None   Patient tolerance: Patient tolerated the procedure well with no immediate complications.   Procedures  Critical Care performed: No  ____________________________________________   INITIAL IMPRESSION / ASSESSMENT AND PLAN / ED COURSE  Pertinent labs & imaging results that were available during my care of the patient were reviewed by me and considered in my medical decision making (see chart for details).  Spoke with patient's daughter. Patient has severe dementia with baseline behavioral trouble. The patient does not appear to be at risk of harming herself her own and else, this appears related to her chronic dementia. Small laceration, repaired with Dermabond without concern and bandage. No evidence of foreign body. Patient is up-to-date on her immunizations according to family.  Discussed with the daughter, daughter will contact [facility] for possible transportation home, if unable to daughter understands he may need to send her back by EMS that she may receive a bill for  this.    We'll place patient on Keflex for infection prevention.   ----------------------------------------- 9:59 AM on 07/05/2016 -----------------------------------------  Reviewed discharge plan with patient's daughter Steward Drone. She is agreeable, also discussed return precautions and signs of possible infection. Patient's daughter has spoken with [facility], they cannot provide transport back to the facility and family requests that EMS be used. Discussed that they may receive a bill for this (EMS transport), and patient's daughters agreeable  ____________________________________________   FINAL CLINICAL IMPRESSION(S) / ED DIAGNOSES  Final diagnoses:    Laceration of middle finger of right hand without complication, initial encounter      NEW MEDICATIONS STARTED DURING THIS VISIT:  New Prescriptions   CEPHALEXIN (KEFLEX) 250 MG CAPSULE    Take 1 capsule (250 mg total) by mouth 4 (four) times daily.     Note:  This document was prepared using Dragon voice recognition software and may include unintentional dictation errors.     Sharyn Creamer, MD 07/05/16 [phone]

## 2016-07-05 NOTE — ED Notes (Signed)
Called EMS for transport [phone]

## 2016-07-05 NOTE — ED Notes (Signed)
Patient's middle finger laceration cleansed with NS and dermabond applied.  Covered with non-stick dressing and covered with band-aid. Pt combative, but is calming down after xanax.

## 2016-07-05 NOTE — ED Triage Notes (Signed)
[facility] after she got a hold of a butter knife in the dinning area. Pt was combative on scene with EMS. Has hx of dementia and alzheimer's. Pt has laceration to the right middle finger, bleeding controlled at this time.

## 2017-01-16 ENCOUNTER — Emergency Department: Payer: Medicare Other

## 2017-01-16 ENCOUNTER — Emergency Department
Admission: EM | Admit: 2017-01-16 | Discharge: 2017-01-16 | Disposition: A | Discharge status: Nursing Home (Includes ICF) | Payer: Medicare Other | Attending: Emergency Medicine | Admitting: Emergency Medicine

## 2017-01-16 ENCOUNTER — Other Ambulatory Visit: Payer: Self-pay

## 2017-01-16 DIAGNOSIS — Y939 Activity, unspecified: Secondary | ICD-10-CM | POA: Insufficient documentation

## 2017-01-16 DIAGNOSIS — G309 Alzheimer's disease, unspecified: Secondary | ICD-10-CM | POA: Insufficient documentation

## 2017-01-16 DIAGNOSIS — X58XXXA Exposure to other specified factors, initial encounter: Secondary | ICD-10-CM | POA: Diagnosis not present

## 2017-01-16 DIAGNOSIS — Z23 Encounter for immunization: Secondary | ICD-10-CM | POA: Diagnosis not present

## 2017-01-16 DIAGNOSIS — Y92199 Unspecified place in other specified residential institution as the place of occurrence of the external cause: Secondary | ICD-10-CM | POA: Insufficient documentation

## 2017-01-16 DIAGNOSIS — Y999 Unspecified external cause status: Secondary | ICD-10-CM | POA: Diagnosis not present

## 2017-01-16 DIAGNOSIS — S51811A Laceration without foreign body of right forearm, initial encounter: Secondary | ICD-10-CM

## 2017-01-16 DIAGNOSIS — S59911A Unspecified injury of right forearm, initial encounter: Secondary | ICD-10-CM | POA: Diagnosis present

## 2017-01-16 MED ORDER — TETANUS-DIPHTH-ACELL PERTUSSIS 5-2.5-18.5 LF-MCG/0.5 IM SUSP
0.5000 mL | Freq: Once | INTRAMUSCULAR | Status: AC
  Administered 2017-01-16: 0.5 mL via INTRAMUSCULAR
  Filled 2017-01-16: qty 0.5

## 2017-01-16 NOTE — ED Notes (Signed)
Ace wrap applied over steri strips per Dr Shaune PollackLord

## 2017-01-16 NOTE — Discharge Instructions (Signed)
Skin tear was closed with Steri-Strips, leave these in place until healed, up to 2 weeks.  I have covered with an Ace wrap to help patient prevent picking at the area.  She was given a tetanus shot which is good now for 5 years at least.  Return to the emergency room immediately for any new or worsening condition including concern for infection which might include swelling or redness or drainage or fever, or any altered mental status.

## 2017-01-16 NOTE — ED Provider Notes (Signed)
[facility] Emergency Department Provider Note ____________________________________________   I have reviewed the triage vital signs and the triage nursing note.  HISTORY  Chief Complaint Laceration   Historian Level 5 Caveat History Limited by patient with dementia  Spoke with staff by phone as well as daughter which is the power of attorney who is out of town.  HPI Shaynah Ikea Demicco is a 84 y.o. female with Alzheimer's dementia, at the dementia unit at [facility], noted to have right arm bleeding by staff and found to have a skin tear.  There was no reported or witnessed fall.  Patient is at her baseline with her dementia.  She is not more agitated or sleepy.  No clear documented last tetanus shot.  No apparent other injuries to the staff members in terms of patient not complaining of any headache or neck pain or other extremity injuries and she has been walking.   Past Medical History:  Diagnosis Date  . Alzheimer's dementia   . Dementia   . Macular degeneration     Patient Active Problem List   Diagnosis Date Noted  . Slurred speech 05/29/2015  . CVA (cerebral infarction) 05/29/2015    History reviewed. No pertinent surgical history.  Prior to Admission medications   Medication Sig Start Date End Date Taking? Authorizing Provider  aspirin 325 MG tablet Take 325 mg by mouth daily.    [provider]  atorvastatin (LIPITOR) 40 MG tablet Take 1 tablet (40 mg total) by mouth daily at 6 PM. 05/30/15   Enedina Finner, MD  cetirizine (ZYRTEC) 10 MG tablet Take 10 mg by mouth daily as needed for allergies.    [provider]  donepezil (ARICEPT) 10 MG tablet Take 10 mg by mouth at bedtime.    [provider]  ipratropium (ATROVENT) 0.03 % nasal spray Place 2 sprays into both nostrils 3 (three) times daily.    [provider]  LORazepam (ATIVAN) 0.5 MG tablet Take 0.25 mg by mouth every 8 (eight) hours as needed for  anxiety.    [provider]  Multiple Vitamins-Minerals (ICAPS) TABS Take 1 tablet by mouth daily.    [provider]  neomycin-bacitracin-polymyxin (NEOSPORIN) [phone] ointment Apply 1 application topically daily. Pt applies to bilateral legs.    [provider]  vitamin C (ASCORBIC ACID) 500 MG tablet Take 500 mg by mouth daily.    [provider]    No Known Allergies  Family History  Family history unknown: Yes    Social History Social History   Tobacco Use  . Smoking status: Never Smoker  . Smokeless tobacco: Never Used  Substance Use Topics  . Alcohol use: No  . Drug use: No    Review of Systems  Level 5 caveat limited by dementia but staff does not give any concern about altered mental status or recent illnesses or fevers.  Patient denies chest pain or trouble breathing at present.   ____________________________________________   PHYSICAL EXAM:  VITAL SIGNS: ED Triage Vitals  Enc Vitals Group     BP --      Pulse --      Resp --      Temp --      Temp src --      SpO2 --      Weight 01/16/17 1806 140 lb (63.5 kg)     Height 01/16/17 1806 5\' 6"  (1.676 m)     Head Circumference --  Peak Flow --      Pain Score 01/16/17 1805 0     Pain Loc --      Pain Edu? --      Excl. in GC? --      Constitutional: Alert and occasionally following commands. Well appearing overall and in no distress. HEENT   Head: Normocephalic and atraumatic.      Eyes: Conjunctivae are normal. Pupils equal and round.       Ears:         Nose: No congestion/rhinnorhea.   Mouth/Throat: Mucous membranes are moist.   Neck: No stridor. Cardiovascular/Chest: Normal rate, regular rhythm.  No murmurs, rubs, or gallops. Respiratory: Normal respiratory effort without tachypnea nor retractions. Breath sounds are clear and equal bilaterally. No wheezes/rales/rhonchi. Gastrointestinal: Soft. No distention, no guarding, no rebound. Nontender.     Genitourinary/rectal:Deferred Musculoskeletal: Pelvis stable.  Nontender lower extremities.  She has a skin tear to the right arm which is into the muscle.  She is not really cooperative with me examining that but there does not appear to be a deformity.  Neurovascularly intact in that upper extremity. Neurologic: No facial droop.  Patient is a little agitated and not really that cooperative, but she is moving 4 extremities no apparent gross motor or sensory deficit. Skin:  Skin is warm, dry and intact. No rash noted. Psychiatric: Slightly agitated but somewhat redirectable   ____________________________________________  LABS (pertinent positives/negatives) I, Governor Rooksebecca Sui Kasparek, MD the attending physician have reviewed the labs noted below.  Labs Reviewed - No data to display  ____________________________________________    EKG I, Governor Rooksebecca Kerith Sherley, MD, the attending physician have personally viewed and interpreted all ECGs.  None ____________________________________________  RADIOLOGY All Xrays were viewed by me.  Reviewed and interpreted by me, Dr. Governor Rooksebecca Abreanna Drawdy  Right forearm x-ray: No fracture. __________________________________________  PROCEDURES  Procedure(s) performed: Laceration repair Performed by myself, Dr. Shaune PollackLord MD.  Steri-Strips applied, 4.  Critical Care performed: None   ____________________________________________  ED COURSE / ASSESSMENT AND PLAN  Pertinent labs & imaging results that were available during my care of the patient were reviewed by me and considered in my medical decision making (see chart for details).   Patient from a dementia unit cannot provide any history herself.  I spoke with the staff, she was acting her normal self and then noticed that her arm was bleeding.  There is no reported or witnessed fall.  She does not appear to have any other areas of trauma to her head or other extremities.  Staff does not report any change in mental status.   I spoke with her daughter power of attorney who is out of town, and we discussed that without any additional indicators, or not to pursue any additional testing, other than evaluation of the skin tear to the right forearm.  Skin there is fairly deep, however was able to be approximated with Steri-Strips and 4 were placed.  Ace wrap will be placed across the top so that she does not pick at it.  X-ray was performed, no underlying fracture.  She was given updated tetanus shot.  She will need nonemergency transport back.   CONSULTATIONS:   None   Patient / Family / Caregiver informed of clinical course, medical decision-making process, and agree with plan.   I discussed return precautions, follow-up instructions, and discharge instructions with patient and/or family.  Discharge Instructions : Skin tear was closed with Steri-Strips, leave these in place until healed, up to 2  weeks.  I have covered with an Ace wrap to help patient prevent picking at the area.  She was given a tetanus shot which is good now for 5 years at least.  Return to the emergency room immediately for any new or worsening condition including concern for infection which might include swelling or redness or drainage or fever, or any altered mental status.    ___________________________________________   FINAL CLINICAL IMPRESSION(S) / ED DIAGNOSES   Final diagnoses:  Skin tear of right forearm without complication, initial encounter      ___________________________________________        Note: This dictation was prepared with Dragon dictation. Any transcriptional errors that result from this process are unintentional    Governor Rooks, MD 01/16/17 2053

## 2017-01-16 NOTE — ED Notes (Signed)
Xray at pt bedside to complete testing

## 2017-01-16 NOTE — ED Notes (Signed)
This tech at pt bedside for safety sitter, this tech will continue to monitor for pt safety

## 2017-01-16 NOTE — ED Triage Notes (Signed)
[facility] skin tear/laceration to right lower arm

## 2017-02-14 ENCOUNTER — Other Ambulatory Visit: Payer: Self-pay

## 2017-02-14 ENCOUNTER — Encounter: Payer: Self-pay | Admitting: Emergency Medicine

## 2017-02-14 ENCOUNTER — Emergency Department
Admission: EM | Admit: 2017-02-14 | Discharge: 2017-02-15 | Disposition: A | Discharge status: Home / Self Care | Payer: Medicare Other | Attending: Emergency Medicine | Admitting: Emergency Medicine

## 2017-02-14 DIAGNOSIS — I4891 Unspecified atrial fibrillation: Secondary | ICD-10-CM | POA: Diagnosis not present

## 2017-02-14 DIAGNOSIS — F0281 Dementia in other diseases classified elsewhere with behavioral disturbance: Secondary | ICD-10-CM | POA: Insufficient documentation

## 2017-02-14 DIAGNOSIS — R197 Diarrhea, unspecified: Secondary | ICD-10-CM | POA: Insufficient documentation

## 2017-02-14 DIAGNOSIS — Z79899 Other long term (current) drug therapy: Secondary | ICD-10-CM | POA: Diagnosis not present

## 2017-02-14 DIAGNOSIS — R112 Nausea with vomiting, unspecified: Secondary | ICD-10-CM

## 2017-02-14 DIAGNOSIS — E860 Dehydration: Secondary | ICD-10-CM

## 2017-02-14 DIAGNOSIS — Z7982 Long term (current) use of aspirin: Secondary | ICD-10-CM | POA: Diagnosis not present

## 2017-02-14 DIAGNOSIS — G308 Other Alzheimer's disease: Secondary | ICD-10-CM | POA: Diagnosis not present

## 2017-02-14 LAB — URINALYSIS, COMPLETE (UACMP) WITH MICROSCOPIC
BACTERIA UA: NONE SEEN
BILIRUBIN URINE: NEGATIVE
Glucose, UA: NEGATIVE mg/dL
Hgb urine dipstick: NEGATIVE
Ketones, ur: 20 mg/dL — AB
Leukocytes, UA: NEGATIVE
Nitrite: NEGATIVE
Protein, ur: NEGATIVE mg/dL
SPECIFIC GRAVITY, URINE: 1.019 (ref 1.005–1.030)
pH: 5 (ref 5.0–8.0)

## 2017-02-14 LAB — CBC
HEMATOCRIT: 43.6 % (ref 35.0–47.0)
Hemoglobin: 14.2 g/dL (ref 12.0–16.0)
MCH: 30 pg (ref 26.0–34.0)
MCHC: 32.5 g/dL (ref 32.0–36.0)
MCV: 92.3 fL (ref 80.0–100.0)
Platelets: 171 10*3/uL (ref 150–440)
RBC: 4.72 MIL/uL (ref 3.80–5.20)
RDW: 15.7 % — AB (ref 11.5–14.5)
WBC: 10.3 10*3/uL (ref 3.6–11.0)

## 2017-02-14 LAB — COMPREHENSIVE METABOLIC PANEL
ALBUMIN: 4.2 g/dL (ref 3.5–5.0)
ALT: 32 U/L (ref 14–54)
AST: 43 U/L — AB (ref 15–41)
Alkaline Phosphatase: 120 U/L (ref 38–126)
Anion gap: 10 (ref 5–15)
BILIRUBIN TOTAL: 0.7 mg/dL (ref 0.3–1.2)
BUN: 28 mg/dL — AB (ref 6–20)
CO2: 26 mmol/L (ref 22–32)
Calcium: 9.2 mg/dL (ref 8.9–10.3)
Chloride: 104 mmol/L (ref 101–111)
Creatinine, Ser: 0.7 mg/dL (ref 0.44–1.00)
GFR calc Af Amer: >60 mL/min (ref >60)
GFR calc non Af Amer: >60 mL/min (ref >60)
Glucose, Bld: 139 mg/dL — ABNORMAL HIGH (ref 65–99)
POTASSIUM: 4.2 mmol/L (ref 3.5–5.1)
SODIUM: 140 mmol/L (ref 135–145)
Total Protein: 7.9 g/dL (ref 6.5–8.1)

## 2017-02-14 LAB — LIPASE, BLOOD: LIPASE: 46 U/L (ref 11–51)

## 2017-02-14 MED ORDER — DILTIAZEM HCL 100 MG IV SOLR: 5.0000 mg/h | Freq: Once | INTRAVENOUS | Status: DC

## 2017-02-14 MED ORDER — SODIUM CHLORIDE 0.9 % IV BOLUS (SEPSIS)
500.0000 mL | Freq: Once | INTRAVENOUS | Status: AC
  Administered 2017-02-14: 500 mL via INTRAVENOUS

## 2017-02-14 MED ORDER — SODIUM CHLORIDE 0.9 % IV BOLUS (SEPSIS): 250.0000 mL | Freq: Once | INTRAVENOUS | Status: DC

## 2017-02-14 MED ORDER — DILTIAZEM HCL ER COATED BEADS 120 MG PO CP24
120.0000 mg | ORAL_CAPSULE | Freq: Once | ORAL | Status: DC
  Filled 2017-02-14: qty 1

## 2017-02-14 MED ORDER — ONDANSETRON HCL 4 MG/2ML IJ SOLN
4.0000 mg | Freq: Once | INTRAMUSCULAR | Status: AC | PRN
  Administered 2017-02-14: 4 mg via INTRAVENOUS
  Filled 2017-02-14: qty 2

## 2017-02-14 MED ORDER — MAGNESIUM SULFATE 2 GM/50ML IV SOLN
2.0000 g | Freq: Once | INTRAVENOUS | Status: AC
  Administered 2017-02-15: 2 g via INTRAVENOUS
  Filled 2017-02-14: qty 50

## 2017-02-14 MED ORDER — DILTIAZEM HCL 25 MG/5ML IV SOLN
15.0000 mg | Freq: Once | INTRAVENOUS | Status: AC
  Administered 2017-02-15: 15 mg via INTRAVENOUS
  Filled 2017-02-14: qty 5

## 2017-02-14 NOTE — ED Triage Notes (Signed)
[facility] rm [facility], c/o n/v/d x 1 hour, states severe.  Reports employees at facility have gone home sick with similar sx.  VSS en route, a fib 105-120.  Pt w/ baseline dementia, uncooperative.

## 2017-02-14 NOTE — ED Provider Notes (Signed)
[facility] Emergency Department Provider Note  ____________________________________________  Time seen: Approximately 11:57 PM  I have reviewed the triage vital signs and the nursing notes.   HISTORY  Chief Complaint Emesis and Diarrhea  Level 5 caveat:  Portions of the history and physical were unable to be obtained due to advanced dementia   HPI Kathleen Chandler is a 84 y.o. female with a history of CVA, paroxysmal afib, dementia who presents from her skilled nursing facility for nausea, vomiting, diarrhea.Patient had one hour of nonbloody nonbilious emesis and watery diarrhea this evening and was found to be in atrial fibrillation with a rate of 105-120 which prompted EMS to be called. Patient does not have a history of atrial fibrillation. Patient will only say no when I try to exam her, will not answer to questions which per SNF is her baseline.  Past Medical History:  Diagnosis Date  . Alzheimer's dementia   . Dementia   . Macular degeneration     Patient Active Problem List   Diagnosis Date Noted  . Slurred speech 05/29/2015  . CVA (cerebral infarction) 05/29/2015    History reviewed. No pertinent surgical history.  Prior to Admission medications   Medication Sig Start Date End Date Taking? Authorizing Provider  ALPRAZolam Prudy Feeler) 0.5 MG tablet Take 0.5 mg by mouth every 12 (twelve) hours as needed for anxiety.   Yes [provider]  aspirin 81 MG chewable tablet Chew 81 mg by mouth daily.   Yes [provider]  atorvastatin (LIPITOR) 40 MG tablet Take 1 tablet (40 mg total) by mouth daily at 6 PM. Patient taking differently: Take 20 mg by mouth daily at 6 PM.  05/30/15  Yes Enedina Finner, MD  Cholecalciferol (D3-1000) 1000 units tablet Take 1,000 Units by mouth daily.   Yes [provider]  citalopram (CELEXA) 40 MG tablet Take 40 mg by mouth daily.   Yes [provider]  Multiple Vitamins-Minerals  (ICAPS) TABS Take 1 tablet by mouth daily.   Yes [provider]  traZODone (DESYREL) 50 MG tablet Take 25-50 mg by mouth. TAKE 50MG  BY MOUTH DAILY AT 3PM, 25MG  AT BEDTIME AND 25MG  TWICE DAILY AS NEEDED FOR AGITATION   Yes [provider]  diltiazem (CARDIZEM CD) 120 MG 24 hr capsule Take 1 capsule (120 mg total) by mouth daily. 02/15/17 02/15/18  Nita Sickle, MD    Allergies Patient has no known allergies.  Family History  Family history unknown: Yes    Social History Social History   Tobacco Use  . Smoking status: Never Smoker  . Smokeless tobacco: Never Used  Substance Use Topics  . Alcohol use: No  . Drug use: No    Review of Systems  Constitutional: Negative for fever. Gastrointestinal: Negative for abdominal pain. + vomiting and diarrhea.  Level 5 caveat:  Portions of the history and physical were unable to be obtained due to dementia  ____________________________________________   PHYSICAL EXAM:  VITAL SIGNS: ED Triage Vitals  Enc Vitals Group     BP 02/14/17 1842 100/80     Pulse Rate 02/14/17 1842 (!) 138     Resp 02/14/17 1842 12     Temp 02/14/17 1849 99 F (37.2 C)     Temp Source 02/14/17 1849 Rectal     SpO2 02/14/17 1842 98 %     Weight 02/14/17 1843 140 lb (63.5 kg)     Height 02/14/17 1843 5\' 4"  (1.626 m)  Head Circumference --      Peak Flow --      Pain Score --      Pain Loc --      Pain Edu? --      Excl. in GC? --     Constitutional: Alert and oriented. Well appearing and in no apparent distress. HEENT:      Head: Normocephalic and atraumatic.         Eyes: Conjunctivae are normal. Sclera is non-icteric.       Mouth/Throat: Mucous membranes are moist.       Neck: Supple with no signs of meningismus. Cardiovascular: Irregularly irregular rhythm with tachycardic rate. No murmurs, gallops, or rubs. 2+ symmetrical distal pulses are present in all extremities. No JVD. Respiratory: Normal respiratory effort. Lungs  are clear to auscultation bilaterally. No wheezes, crackles, or rhonchi.  Gastrointestinal: Soft, non tender, and non distended with positive bowel sounds. No rebound or guarding. Musculoskeletal: Nontender with normal range of motion in all extremities. No edema, cyanosis, or erythema of extremities. Neurologic:  Face is symmetric. Moving all extremities.  Skin: Skin is warm, dry and intact. No rash noted. ____________________________________________   LABS (all labs ordered are listed, but only abnormal results are displayed)  Labs Reviewed  COMPREHENSIVE METABOLIC PANEL - Abnormal; Notable for the following components:      Result Value   Glucose, Bld 139 (*)    BUN 28 (*)    AST 43 (*)    All other components within normal limits  CBC - Abnormal; Notable for the following components:   RDW 15.7 (*)    All other components within normal limits  URINALYSIS, COMPLETE (UACMP) WITH MICROSCOPIC - Abnormal; Notable for the following components:   Color, Urine YELLOW (*)    APPearance CLEAR (*)    Ketones, ur 20 (*)    Squamous Epithelial / LPF 0-5 (*)    All other components within normal limits  C DIFFICILE QUICK SCREEN W PCR REFLEX  LIPASE, BLOOD  TROPONIN I   ____________________________________________  EKG  ED ECG REPORT I, Nita Sicklearolina Merion Grimaldo, the attending physician, personally viewed and interpreted this ECG.  Afib, rate of 129, normal QTC, normal axis, slight ST depressions on lateral leads, no ST elevation. A. fib is seen on prior EKG from 2017 with a depressions are new. ____________________________________________  RADIOLOGY  none  ____________________________________________   PROCEDURES  Procedure(s) performed: None Procedures Critical Care performed:  None ____________________________________________   INITIAL IMPRESSION / ASSESSMENT AND PLAN / ED COURSE  [age] y.o. female with a history of CVA, dementia who presents from her skilled nursing facility  for nausea, vomiting, diarrhea x 1 hour and new onset of afib. Patient looks dry on exam and 500 cc IVF was given with no resolution of afib. She has an ECHO showing 35% Ef but that is when patient had an episode of afib. Patient's daughter is not aware of a diagnosis of CHF. Therefore I will give her another 500cc bolus and a dose of IV cardizem and IV mag. EKG showing afib with RVR. I had a long discussion with the patient's daughter about risks and benefits of anticoagulation for stroke prevention in the setting of advanced dementia and afib. Her daughter told me that her mother's "exsitence is now very sad with advanced dementia" and she opted to not start patient on anticoagulation. Patient is on asa and I recommended continuing the ASA. Plan to reassess after second bolus, magnesium, and cardizem and if  rate is well controlled patient will be dc back to SNF on cardizem and close f/u with PCP. Patient has had no vomiting or diarrhea in the ED. Care transferred to Dr. Sallee Provencal.      As part of my medical decision making, I reviewed the following data within the electronic MEDICAL RECORD NUMBER History obtained from family, Nursing notes reviewed and incorporated, Labs reviewed , EKG interpreted , Old EKG reviewed, Notes from prior ED visits and [place] Controlled Substance Database    Pertinent labs & imaging results that were available during my care of the patient were reviewed by me and considered in my medical decision making (see chart for details).    ____________________________________________   FINAL CLINICAL IMPRESSION(S) / ED DIAGNOSES  Final diagnoses:  Dehydration  Nausea vomiting and diarrhea  Atrial fibrillation with RVR (HCC)      NEW MEDICATIONS STARTED DURING THIS VISIT:  ED Discharge Orders        Ordered    diltiazem (CARDIZEM CD) 120 MG 24 hr capsule  Daily     02/15/17 0017       Note:  This document was prepared using Dragon voice recognition software and may  include unintentional dictation errors.    Don Perking, [place], MD 02/15/17 [id number]

## 2017-02-14 NOTE — ED Notes (Signed)
Lab called and stated stool needs to be redrawn in urine specimen cup.

## 2017-02-15 LAB — TROPONIN I: Troponin I: <0.03 ng/mL (ref <0.03)

## 2017-02-15 MED ORDER — DILTIAZEM HCL ER COATED BEADS 120 MG PO CP24
120.0000 mg | ORAL_CAPSULE | Freq: Once | ORAL | Status: DC
  Filled 2017-02-15: qty 1

## 2017-02-15 MED ORDER — DILTIAZEM HCL ER COATED BEADS 120 MG PO CP24: 120.0000 mg | ORAL_CAPSULE | Freq: Every day | ORAL | 0 refills | Status: AC

## 2017-02-15 MED ORDER — SODIUM CHLORIDE 0.9 % IV BOLUS (SEPSIS)
500.0000 mL | Freq: Once | INTRAVENOUS | Status: AC
  Administered 2017-02-15: 500 mL via INTRAVENOUS

## 2017-02-15 NOTE — ED Notes (Addendum)
Marchelle FolksAmanda, Secretary alerted to contact EMS for discharge transport

## 2017-06-17 ENCOUNTER — Other Ambulatory Visit: Payer: Self-pay

## 2017-06-17 ENCOUNTER — Emergency Department: Payer: Medicare Other

## 2017-06-17 ENCOUNTER — Emergency Department
Admission: EM | Admit: 2017-06-17 | Discharge: 2017-06-17 | Disposition: A | Discharge status: Home / Self Care | Payer: Medicare Other | Attending: Emergency Medicine | Admitting: Emergency Medicine

## 2017-06-17 DIAGNOSIS — G309 Alzheimer's disease, unspecified: Secondary | ICD-10-CM | POA: Insufficient documentation

## 2017-06-17 DIAGNOSIS — F0280 Dementia in other diseases classified elsewhere without behavioral disturbance: Secondary | ICD-10-CM | POA: Insufficient documentation

## 2017-06-17 DIAGNOSIS — Y92129 Unspecified place in nursing home as the place of occurrence of the external cause: Secondary | ICD-10-CM | POA: Diagnosis not present

## 2017-06-17 DIAGNOSIS — S0181XA Laceration without foreign body of other part of head, initial encounter: Secondary | ICD-10-CM | POA: Diagnosis not present

## 2017-06-17 DIAGNOSIS — Z8673 Personal history of transient ischemic attack (TIA), and cerebral infarction without residual deficits: Secondary | ICD-10-CM | POA: Insufficient documentation

## 2017-06-17 DIAGNOSIS — W19XXXA Unspecified fall, initial encounter: Secondary | ICD-10-CM

## 2017-06-17 DIAGNOSIS — Z7982 Long term (current) use of aspirin: Secondary | ICD-10-CM | POA: Diagnosis not present

## 2017-06-17 DIAGNOSIS — Y939 Activity, unspecified: Secondary | ICD-10-CM | POA: Insufficient documentation

## 2017-06-17 DIAGNOSIS — S0083XA Contusion of other part of head, initial encounter: Secondary | ICD-10-CM

## 2017-06-17 DIAGNOSIS — Z79899 Other long term (current) drug therapy: Secondary | ICD-10-CM | POA: Diagnosis not present

## 2017-06-17 DIAGNOSIS — W0110XA Fall on same level from slipping, tripping and stumbling with subsequent striking against unspecified object, initial encounter: Secondary | ICD-10-CM | POA: Insufficient documentation

## 2017-06-17 DIAGNOSIS — Y998 Other external cause status: Secondary | ICD-10-CM | POA: Diagnosis not present

## 2017-06-17 DIAGNOSIS — S0990XA Unspecified injury of head, initial encounter: Secondary | ICD-10-CM

## 2017-06-17 LAB — CBC
HCT: 36.2 % (ref 35.0–47.0)
Hemoglobin: 11.9 g/dL — ABNORMAL LOW (ref 12.0–16.0)
MCH: 30.2 pg (ref 26.0–34.0)
MCHC: 33 g/dL (ref 32.0–36.0)
MCV: 91.5 fL (ref 80.0–100.0)
PLATELETS: 165 10*3/uL (ref 150–440)
RBC: 3.96 MIL/uL (ref 3.80–5.20)
RDW: 14.3 % (ref 11.5–14.5)
WBC: 7.8 10*3/uL (ref 3.6–11.0)

## 2017-06-17 LAB — BASIC METABOLIC PANEL
Anion gap: 11 (ref 5–15)
BUN: 27 mg/dL — ABNORMAL HIGH (ref 6–20)
CO2: 27 mmol/L (ref 22–32)
Calcium: 9.1 mg/dL (ref 8.9–10.3)
Chloride: 102 mmol/L (ref 101–111)
Creatinine, Ser: 0.66 mg/dL (ref 0.44–1.00)
GFR calc Af Amer: >60 mL/min (ref >60)
Glucose, Bld: 119 mg/dL — ABNORMAL HIGH (ref 65–99)
POTASSIUM: 4.3 mmol/L (ref 3.5–5.1)
SODIUM: 140 mmol/L (ref 135–145)

## 2017-06-17 LAB — URINALYSIS, COMPLETE (UACMP) WITH MICROSCOPIC
BILIRUBIN URINE: NEGATIVE
Bacteria, UA: NONE SEEN
GLUCOSE, UA: NEGATIVE mg/dL
HGB URINE DIPSTICK: NEGATIVE
KETONES UR: 20 mg/dL — AB
Leukocytes, UA: NEGATIVE
NITRITE: NEGATIVE
PROTEIN: NEGATIVE mg/dL
Specific Gravity, Urine: 1.024 (ref 1.005–1.030)
pH: 6 (ref 5.0–8.0)

## 2017-06-17 MED ORDER — LIDOCAINE-EPINEPHRINE-TETRACAINE (LET) SOLUTION
NASAL | Status: AC
  Administered 2017-06-17: 01:00:00
  Filled 2017-06-17: qty 3

## 2017-06-17 MED ORDER — LIDOCAINE-EPINEPHRINE 2 %-1:100000 IJ SOLN
5.0000 mL | Freq: Once | INTRAMUSCULAR | Status: AC
  Administered 2017-06-17: 5 mL via INTRADERMAL
  Filled 2017-06-17: qty 5.1

## 2017-06-17 MED ORDER — LORAZEPAM 2 MG/ML IJ SOLN
0.5000 mg | Freq: Once | INTRAMUSCULAR | Status: AC
  Administered 2017-06-17: 0.5 mg via INTRAVENOUS
  Filled 2017-06-17: qty 1

## 2017-06-17 MED ORDER — BACITRACIN-NEOMYCIN-POLYMYXIN 400-5-5000 EX OINT
TOPICAL_OINTMENT | Freq: Once | CUTANEOUS | Status: AC
  Administered 2017-06-17: 1 via TOPICAL
  Filled 2017-06-17: qty 1

## 2017-06-17 NOTE — ED Notes (Signed)
Patient transported to CT 

## 2017-06-17 NOTE — ED Triage Notes (Signed)
Pt fell at nursing facility and was down for a unknown amount of time per nursing staff. Large hematoma to left side of forehead. Pt confused with no distress.

## 2017-06-17 NOTE — ED Notes (Signed)
Pt resting quietly in room with eyes closed, respirations equal and unlabored. Waiting for EMS transport home.

## 2017-06-17 NOTE — ED Notes (Signed)
LEFT AC IV access removed prior to transport.  PT belongings given to EMS.

## 2017-06-17 NOTE — ED Provider Notes (Signed)
[facility] Emergency Department Provider Note   ____________________________________________   First MD Initiated Contact with Patient 06/17/17 [phone]     (approximate)  I have reviewed the triage vital signs and the nursing notes.   HISTORY  Chief Complaint Fall  Patient with a history of dementia and unable to participate in history  HPI Kathleen Chandler is a 84 y.o. female who comes from [facility] unit.  The patient was found on the floor in her room.  The patient had an unwitnessed fall.  There was blood all over the floor and a hematoma to her forehead.  The patient is minimally verbal but that is her baseline.  She takes a daily aspirin.  She was sent for repair of her injuries and evaluation.   Past Medical History:  Diagnosis Date  . Alzheimer's dementia   . Dementia   . Macular degeneration     Patient Active Problem List   Diagnosis Date Noted  . Slurred speech 05/29/2015  . CVA (cerebral infarction) 05/29/2015    History reviewed. No pertinent surgical history.  Prior to Admission medications   Medication Sig Start Date End Date Taking? Authorizing Provider  ALPRAZolam Prudy Feeler(XANAX) 0.5 MG tablet Take 0.5 mg by mouth every 12 (twelve) hours as needed for anxiety.    [provider]  aspirin 81 MG chewable tablet Chew 81 mg by mouth daily.    [provider]  atorvastatin (LIPITOR) 40 MG tablet Take 1 tablet (40 mg total) by mouth daily at 6 PM. Patient taking differently: Take 20 mg by mouth daily at 6 PM.  05/30/15   Enedina FinnerPatel, Sona, MD  Cholecalciferol (D3-1000) 1000 units tablet Take 1,000 Units by mouth daily.    [provider]  citalopram (CELEXA) 40 MG tablet Take 40 mg by mouth daily.    [provider]  diltiazem (CARDIZEM CD) 120 MG 24 hr capsule Take 1 capsule (120 mg total) by mouth daily. 02/15/17 02/15/18  Nita SickleVeronese, Hawkins, MD  Multiple Vitamins-Minerals (ICAPS) TABS Take 1 tablet by  mouth daily.    [provider]  traZODone (DESYREL) 50 MG tablet Take 25-50 mg by mouth. TAKE 50MG  BY MOUTH DAILY AT 3PM, 25MG  AT BEDTIME AND 25MG  TWICE DAILY AS NEEDED FOR AGITATION    [provider]    Allergies Patient has no known allergies.  Family History  Family history unknown: Yes    Social History Social History   Tobacco Use  . Smoking status: Never Smoker  . Smokeless tobacco: Never Used  Substance Use Topics  . Alcohol use: No  . Drug use: No    Review of Systems  Patient with dementia and unable to participate in history  ____________________________________________   PHYSICAL EXAM:  VITAL SIGNS: ED Triage Vitals  Enc Vitals Group     BP 06/17/17 0143 110/88     Pulse Rate 06/17/17 0143 63     Resp 06/17/17 0143 11     Temp 06/17/17 0121 97.7 F (36.5 C)     Temp Source 06/17/17 0121 Axillary     SpO2 06/17/17 0143 100 %     Weight 06/17/17 0150 140 lb (63.5 kg)     Height 06/17/17 0150 5\' 4"  (1.626 m)     Head Circumference --      Peak Flow --      Pain Score 06/17/17 0150 0     Pain Loc --      Pain  Edu? --      Excl. in GC? --     Constitutional: Alert and disoriented. Well appearing and in moderate distress. Eyes: Conjunctivae are normal. PERRL. EOMI. Head: hematoma to forehead . Nose: No congestion/rhinnorhea. Mouth/Throat: Mucous membranes are moist.  Oropharynx non-erythematous. Neck: No cervical spine tenderness to palpation. Cardiovascular: Normal rate, regular rhythm. Grossly normal heart sounds.  Good peripheral circulation. Respiratory: Normal respiratory effort.  No retractions. Lungs CTAB. Gastrointestinal: Soft and nontender. No distention. Positive bowel sounds Musculoskeletal: No lower extremity tenderness nor edema.   Neurologic:  Normal speech and language. Positive bowel sounds Skin:  Skin is warm, small lacerations to forehead.  Psychiatric: Mood and affect are normal.    ____________________________________________   LABS (all labs ordered are listed, but only abnormal results are displayed)  Labs Reviewed  CBC - Abnormal; Notable for the following components:      Result Value   Hemoglobin 11.9 (*)    All other components within normal limits  BASIC METABOLIC PANEL - Abnormal; Notable for the following components:   Glucose, Bld 119 (*)    BUN 27 (*)    All other components within normal limits  URINALYSIS, COMPLETE (UACMP) WITH MICROSCOPIC - Abnormal; Notable for the following components:   Color, Urine YELLOW (*)    APPearance CLEAR (*)    Ketones, ur 20 (*)    All other components within normal limits   ____________________________________________  EKG  ED ECG REPORT I, Rebecka Apley, the attending physician, personally viewed and interpreted this ECG.   Date: 06/17/2017  EKG Time: 143  Rate:   Rhythm: normal EKG, normal sinus rhythm, unchanged from previous tracings, atrial fibrillation, rate 91  Axis: normal  Intervals:none  ST&T Change: normal  ____________________________________________  RADIOLOGY  ED MD interpretation:  CT head and cervical spine: Midline frontal scalp hematoma, No acute intracranial abnormality, No skull fracture, slight progression in generalized atrophy since 2017. Chronic small vessel ischemia similar to prior, Multilevel degenerative change in the cervical spine without acute fracture or subluxation.  DG Humerus Left: Negative   DG Pelvis 1-2: Negative  Official radiology report(s): Dg Pelvis 1-2 Views  Result Date: 06/17/2017 CLINICAL DATA:  Bruising after fall. EXAM: PELVIS - 1-2 VIEW COMPARISON:  None. FINDINGS: There is no evidence of pelvic fracture or diastasis. No advanced hip osteoarthrosis. No pelvic bone lesions are seen. Advanced degenerative changes lower lumbar spine. Moderate amount of retained large bowel stool. IMPRESSION: Negative. Electronically Signed   By: Awilda Metro  M.D.   On: 06/17/2017 01:41   Ct Head Wo Contrast  Result Date: 06/17/2017 CLINICAL DATA:  Unwitnessed fall at nursing home. EXAM: CT HEAD WITHOUT CONTRAST CT CERVICAL SPINE WITHOUT CONTRAST TECHNIQUE: Multidetector CT imaging of the head and cervical spine was performed following the standard protocol without intravenous contrast. Multiplanar CT image reconstructions of the cervical spine were also generated. COMPARISON:  Head CT 05/29/2015 FINDINGS: CT HEAD FINDINGS Brain: Generalized atrophy, with mild progression from prior. Chronic small vessel ischemia is similar. Remote lacunar infarct in left basal ganglia. Remote left temporal infarct. No intracranial hemorrhage, mass effect, or midline shift. No hydrocephalus. The basilar cisterns are patent. No evidence of territorial infarct or acute ischemia. No extra-axial or intracranial fluid collection. Vascular: Atherosclerosis of skullbase vasculature without hyperdense vessel or abnormal calcification. Skull: No fracture or focal lesion. Sinuses/Orbits: Paranasal sinuses and mastoid air cells are clear. The visualized orbits are unremarkable. Bilateral cataract resection. Other: Midline frontal scalp hematoma. CT  CERVICAL SPINE FINDINGS Alignment: Mild anterolisthesis of C4 on C5 and C5 on C6 is likely degenerative and facet mediated. No jumped or perched facets. C1 is well aligned on C2. Skull base and vertebrae: No acute fracture. Vertebral body heights are maintained. The dens and skull base are intact. Soft tissues and spinal canal: No prevertebral fluid or swelling. No visible canal hematoma. Disc levels: Mild for age disc space narrowing and endplate spurring, most prominent at C5-C6 and C6-C7. Moderate multilevel facet arthropathy. Upper chest: No acute finding. Other: None. IMPRESSION: 1. Midline frontal scalp hematoma. No acute intracranial abnormality. No skull fracture. 2. Slight progression in generalized atrophy since 2017. Chronic small vessel  ischemia similar to prior. 3. Multilevel degenerative change in the cervical spine without acute fracture or subluxation. Electronically Signed   By: Rubye Oaks M.D.   On: 06/17/2017 02:30   Ct Cervical Spine Wo Contrast  Result Date: 06/17/2017 CLINICAL DATA:  Unwitnessed fall at nursing home. EXAM: CT HEAD WITHOUT CONTRAST CT CERVICAL SPINE WITHOUT CONTRAST TECHNIQUE: Multidetector CT imaging of the head and cervical spine was performed following the standard protocol without intravenous contrast. Multiplanar CT image reconstructions of the cervical spine were also generated. COMPARISON:  Head CT 05/29/2015 FINDINGS: CT HEAD FINDINGS Brain: Generalized atrophy, with mild progression from prior. Chronic small vessel ischemia is similar. Remote lacunar infarct in left basal ganglia. Remote left temporal infarct. No intracranial hemorrhage, mass effect, or midline shift. No hydrocephalus. The basilar cisterns are patent. No evidence of territorial infarct or acute ischemia. No extra-axial or intracranial fluid collection. Vascular: Atherosclerosis of skullbase vasculature without hyperdense vessel or abnormal calcification. Skull: No fracture or focal lesion. Sinuses/Orbits: Paranasal sinuses and mastoid air cells are clear. The visualized orbits are unremarkable. Bilateral cataract resection. Other: Midline frontal scalp hematoma. CT CERVICAL SPINE FINDINGS Alignment: Mild anterolisthesis of C4 on C5 and C5 on C6 is likely degenerative and facet mediated. No jumped or perched facets. C1 is well aligned on C2. Skull base and vertebrae: No acute fracture. Vertebral body heights are maintained. The dens and skull base are intact. Soft tissues and spinal canal: No prevertebral fluid or swelling. No visible canal hematoma. Disc levels: Mild for age disc space narrowing and endplate spurring, most prominent at C5-C6 and C6-C7. Moderate multilevel facet arthropathy. Upper chest: No acute finding. Other: None.  IMPRESSION: 1. Midline frontal scalp hematoma. No acute intracranial abnormality. No skull fracture. 2. Slight progression in generalized atrophy since 2017. Chronic small vessel ischemia similar to prior. 3. Multilevel degenerative change in the cervical spine without acute fracture or subluxation. Electronically Signed   By: Rubye Oaks M.D.   On: 06/17/2017 02:30   Dg Humerus Left  Result Date: 06/17/2017 CLINICAL DATA:  Bruising after fall. EXAM: LEFT HUMERUS - 2+ VIEW COMPARISON:  None. FINDINGS: There is no evidence of fracture or other focal bone lesions. Non suspicious. Antecubital intravenous catheter. IMPRESSION: Negative. Electronically Signed   By: Awilda Metro M.D.   On: 06/17/2017 01:40    ____________________________________________   PROCEDURES  Procedure(s) performed: please, see procedure note(s).  Marland Kitchen.Laceration Repair Date/Time: 06/17/2017 4:40 AM Performed by: Rebecka Apley, MD Authorized by: Rebecka Apley, MD   Consent:    Consent obtained:  Emergent situation   Risks discussed:  Infection, pain, retained foreign body, poor cosmetic result and poor wound healing Universal protocol:    Patient identity confirmed:  Hospital-assigned identification number Anesthesia (see MAR for exact dosages):    Anesthesia method:  Local infiltration and topical application   Topical anesthetic:  LET   Local anesthetic:  Lidocaine 1% WITH epi Laceration details:    Location:  Face   Face location:  Forehead   Length (cm):  3 Repair type:    Repair type:  Complex Pre-procedure details:    Preparation:  Patient was prepped and draped in usual sterile fashion Exploration:    Hemostasis achieved with:  Direct pressure   Wound exploration: wound explored through full range of motion and entire depth of wound probed and visualized     Contaminated: no   Treatment:    Area cleansed with:  Saline and Betadine   Amount of cleaning:  Standard   Irrigation solution:   Sterile saline   Irrigation method:  Syringe   Visualized foreign bodies/material removed: no     Debridement:  Minimal   Undermining:  None   Scar revision: no   Skin repair:    Repair method:  Sutures   Suture size:  5-0   Suture material:  Nylon   Suture technique:  Running locked   Number of sutures:  7 Approximation:    Approximation:  Close Post-procedure details:    Dressing:  Sterile dressing and antibiotic ointment   Patient tolerance of procedure:  Tolerated well, no immediate complications    Critical Care performed: No  ____________________________________________   INITIAL IMPRESSION / ASSESSMENT AND PLAN / ED COURSE  As part of my medical decision making, I reviewed the following data within the electronic MEDICAL RECORD NUMBER Notes from prior ED visits and [place] Controlled Substance Database   This is an 84 year old female who comes into the hospital today with an unwitnessed fall at her nursing home.  The patient does have a history of dementia.  I did perform some imaging studies which were unremarkable.  I also check some blood work which is negative and the patient's urine does not show any infection.  The patient does have a contusion to her forehead with some areas of bruising.  I will attempt to repair the open areas and then the patient will be sent back to her nursing home.  I was able to repair the patient's wound without any difficulty.  She will be discharged back to her nursing home.  She should have her sutures removed in 5 days.      ____________________________________________   FINAL CLINICAL IMPRESSION(S) / ED DIAGNOSES  Final diagnoses:  Fall, initial encounter  Contusion of face, initial encounter  Injury of head, initial encounter  Facial laceration, initial encounter     ED Discharge Orders    None       Note:  This document was prepared using Dragon voice recognition software and may include unintentional dictation errors.      Rebecka Apley, MD 06/17/17 [phone]

## 2017-06-17 NOTE — Discharge Instructions (Addendum)
PLease follow up and have your sutures removed in 5 days

## 2017-06-17 NOTE — ED Notes (Signed)
Daughter Steward DroneBrenda Covert updated on patient's progress [phone]. Informed her that we are waiting for EMS transport back to facility.

## 2017-06-17 NOTE — ED Notes (Signed)
Transport called through [facility], [facility]

## 2017-06-17 NOTE — ED Notes (Signed)
Lac repair to forehead by Dr.Webster, Pt cleansed from dried blood around scalp, face, and bilateral hands.

## 2017-06-17 NOTE — ED Notes (Signed)
Report received care assumed , fall matts placed around stretcher, fall band added to right wrist, non-skid yellow slipper/socks added to bilateral feet. Supplemental O2 applied [facility] at 2 liters

## 2019-01-15 DEATH — deceased

## 2019-09-22 IMAGING — DX DG FOREARM 2V*R*
1 series · 1 of 1 positions shown · non-contrast
Comparison: None.

CLINICAL DATA: Right forearm injury today.  Initial encounter.

EXAM:
RIGHT FOREARM - 1 VIEW

[forearm ap]
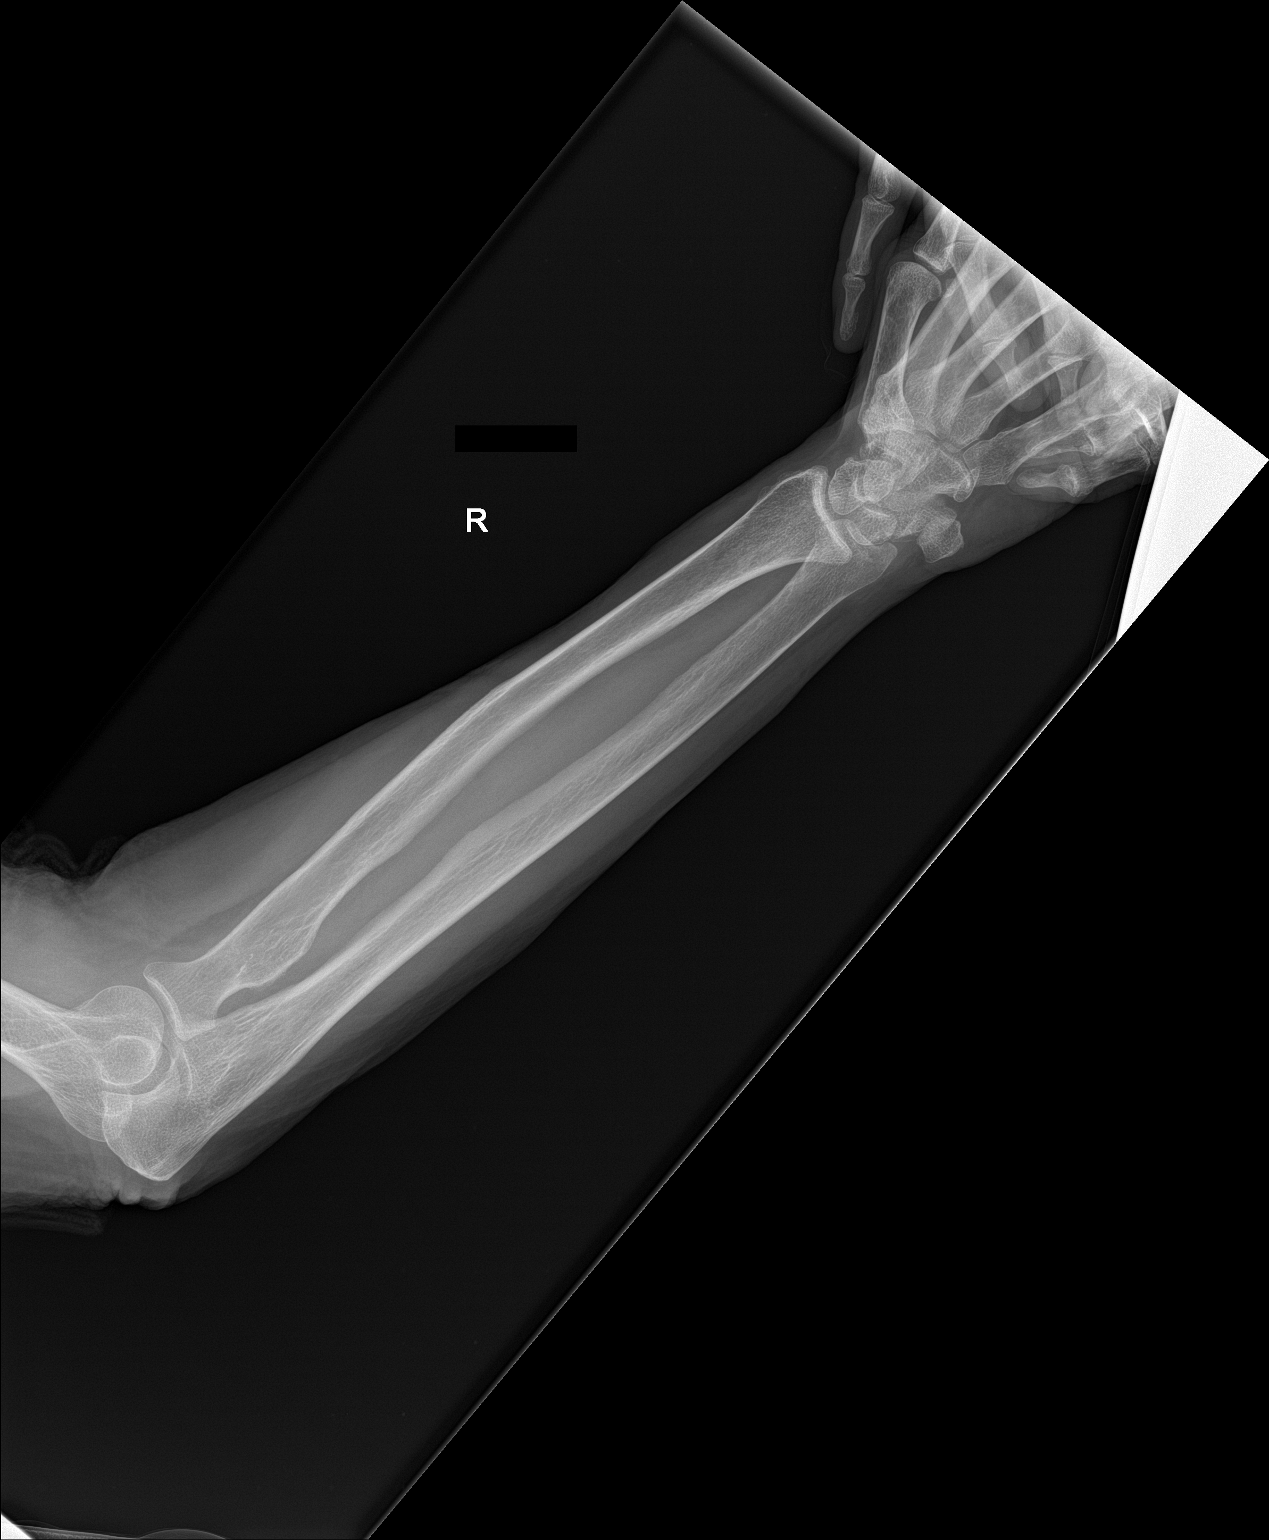

[1 of 1 positions shown; findings below may reference images not displayed]

FINDINGS: Only a single view was obtained as the patient was very combative.

There is no evidence of fracture or other focal bone lesions. Soft
tissues are unremarkable.
IMPRESSION: Negative.

## 2020-02-21 IMAGING — CT CT CERVICAL SPINE W/O CM
3 of 6 series · 11 of 33 positions shown, 12 images · non-contrast
Comparison: Head CT 05/29/2015

CLINICAL DATA: Unwitnessed fall at [HOSPITAL].

EXAM:
CT HEAD WITHOUT CONTRAST
CT CERVICAL SPINE WITHOUT CONTRAST
TECHNIQUE: Multidetector CT imaging of the head and cervical spine was
performed following the standard protocol without intravenous
contrast. Multiplanar CT image reconstructions of the cervical spine
were also generated.

[Series 7: coronal soft tissue · coronal · 0.29mm/px · 3 of 65 slices shown]
[im 15/65  bone]
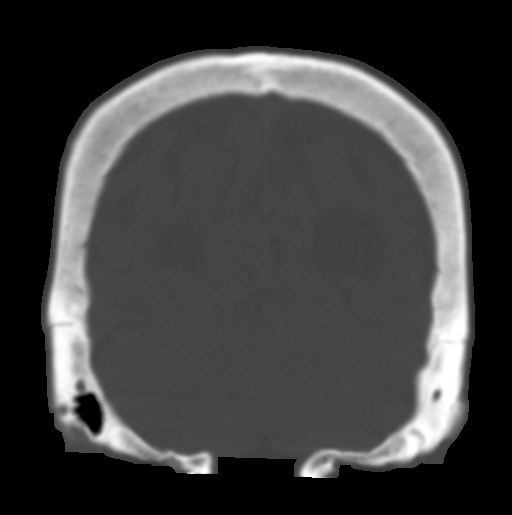
[im 27/65  bone]
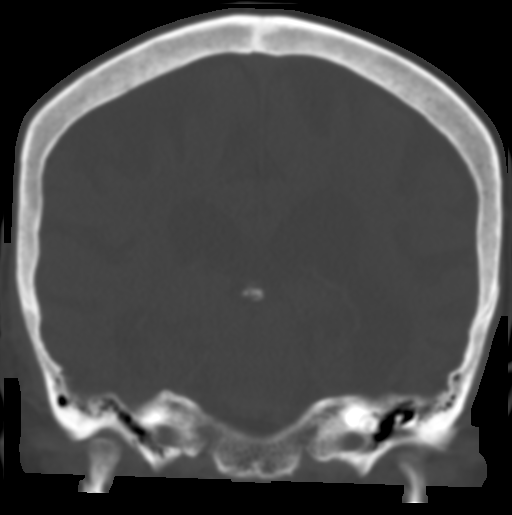
[im 38/65  bone]
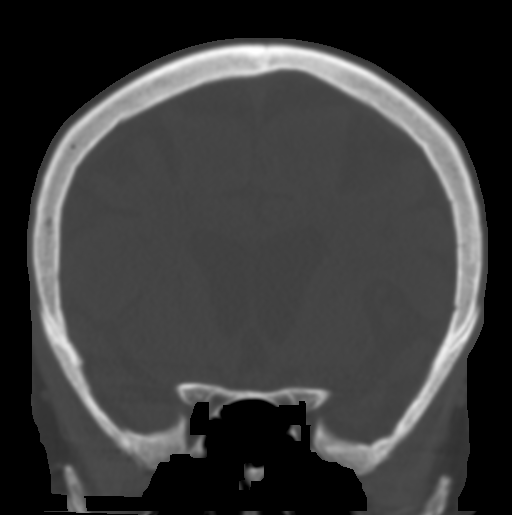

[Series 11: c spine soft · axial · 0.41mm/px · z∈[-287,-213]mm · 3 of 70 slices shown, 4 images]
[im 18/70  soft-tissue]
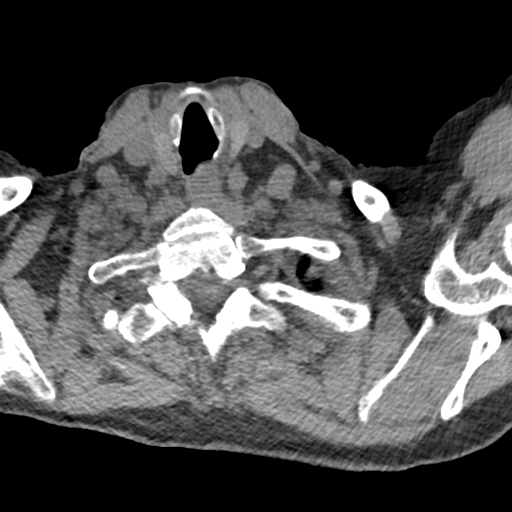
[im 18/70  bone]
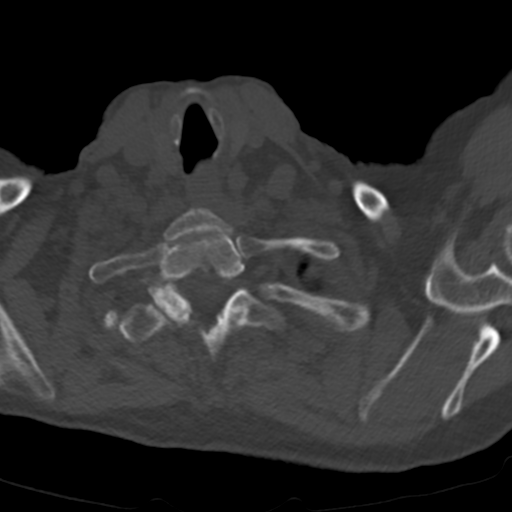
[im 35/70  bone]
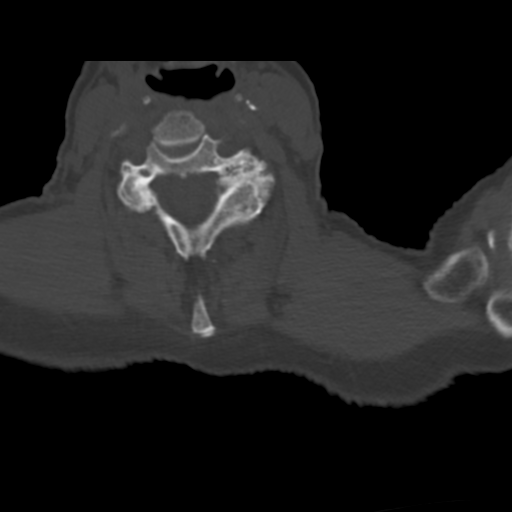
[im 52/70  bone]
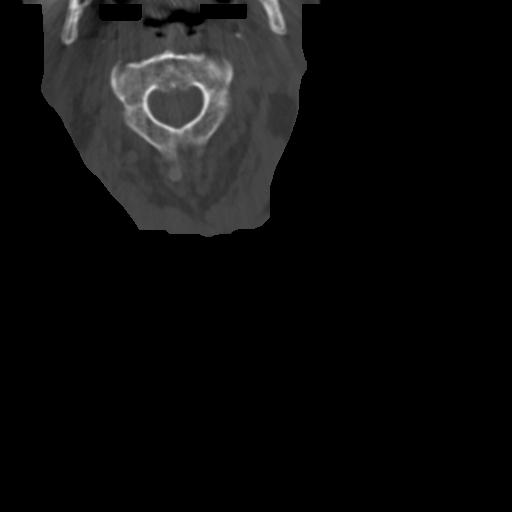

[Series 14: sagittal bone · sagittal · 0.22mm/px · 5 of 55 slices shown]
[im 10/55  bone]
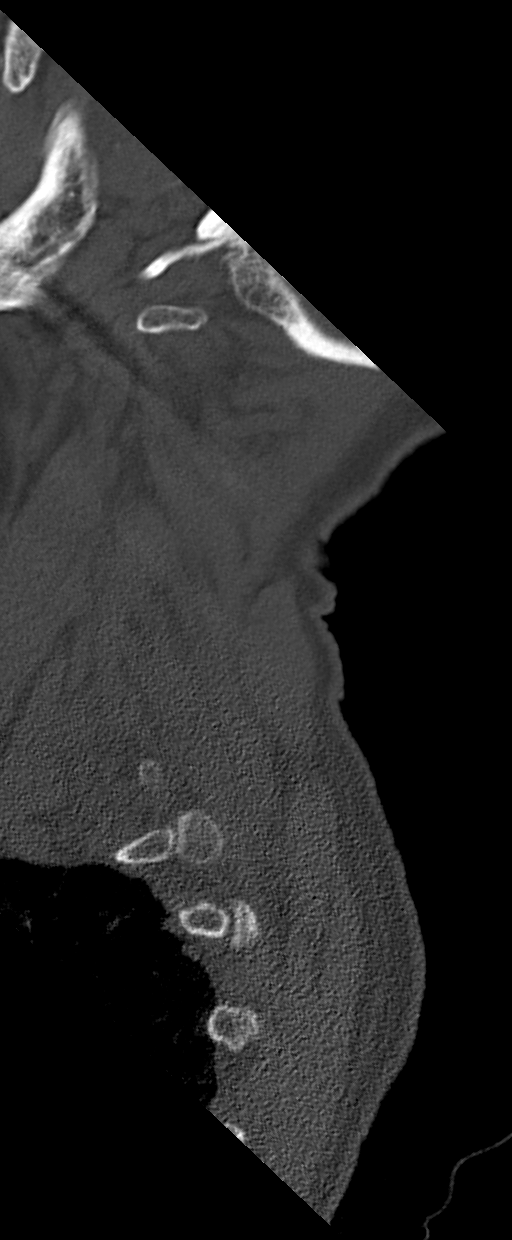
[im 19/55  bone]
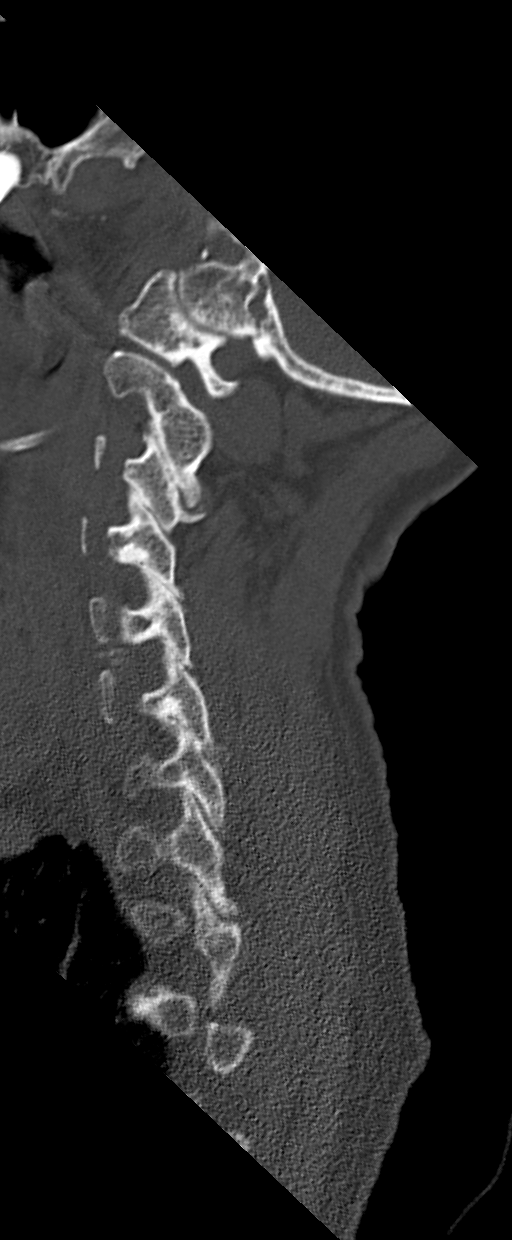
[im 28/55  bone]
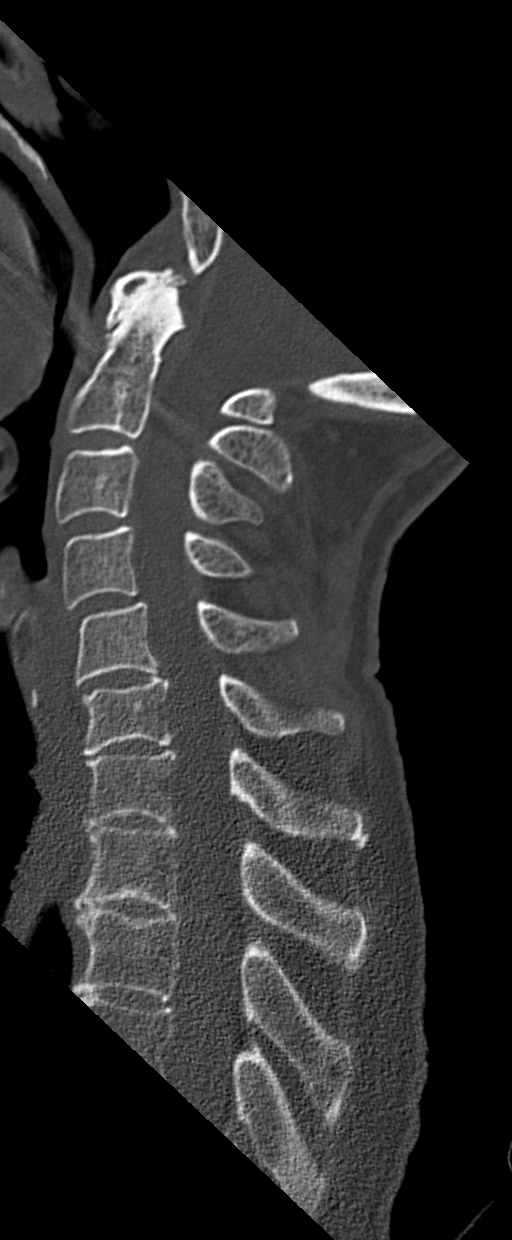
[im 37/55  bone]
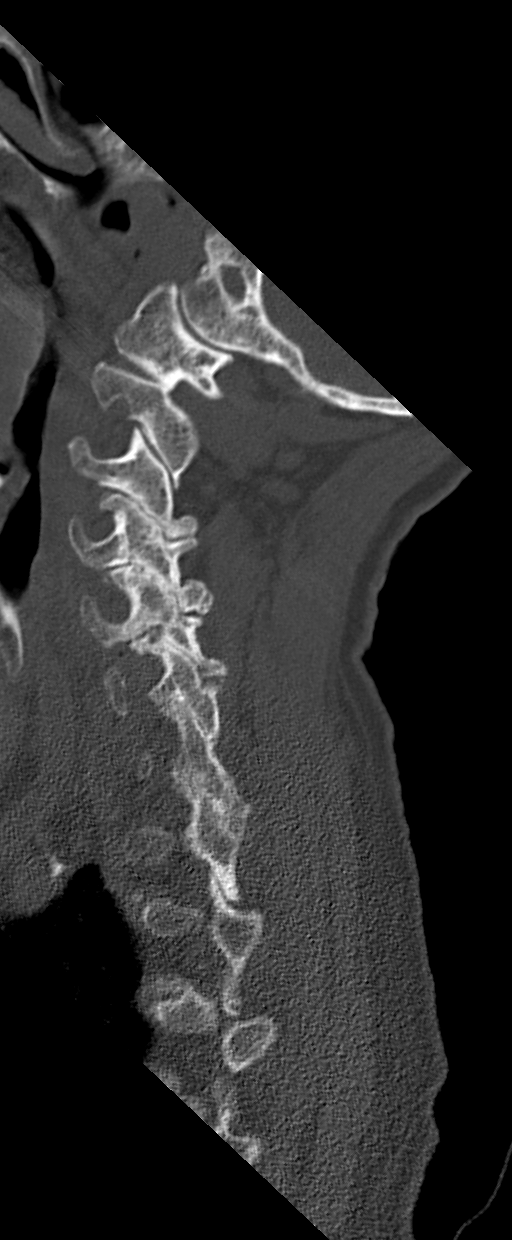
[im 46/55  bone]
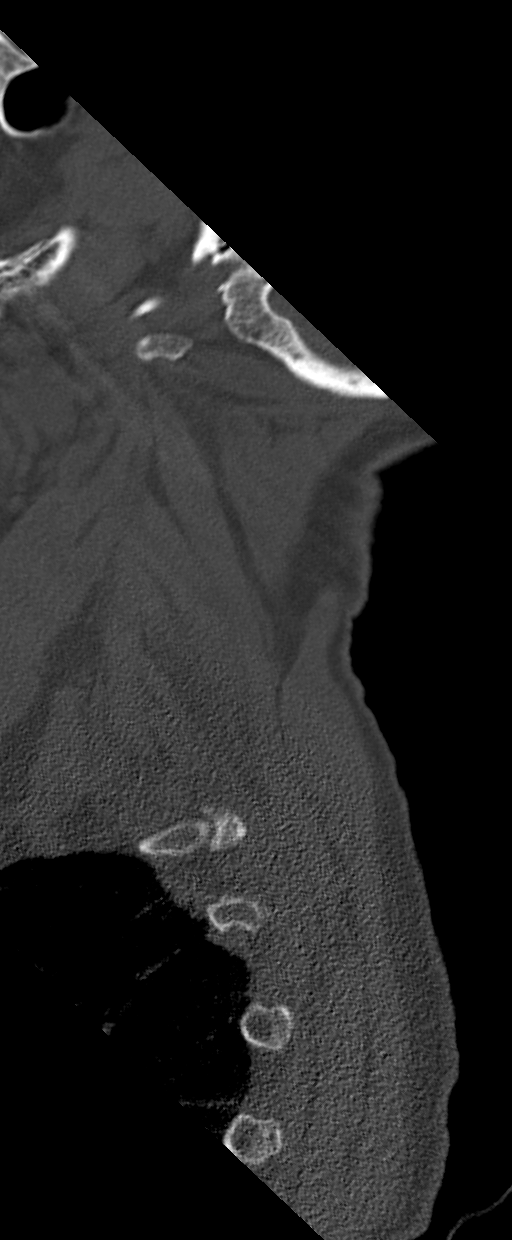

[11 of 33 positions shown; findings below may reference images not displayed]

FINDINGS: CT HEAD FINDINGS

Brain: Generalized atrophy, with mild progression from prior.
Chronic small vessel ischemia is similar. Remote lacunar infarct in
left basal ganglia. Remote left temporal infarct. No intracranial
hemorrhage, mass effect, or midline shift. No hydrocephalus. The
basilar cisterns are patent. No evidence of territorial infarct or
acute ischemia. No extra-axial or intracranial fluid collection.

Vascular: Atherosclerosis of skullbase vasculature without
hyperdense vessel or abnormal calcification.

Skull: No fracture or focal lesion.

Sinuses/Orbits: Paranasal sinuses and mastoid air cells are clear.
The visualized orbits are unremarkable. Bilateral cataract
resection.

Other: Midline frontal scalp hematoma.

CT CERVICAL SPINE FINDINGS

Alignment: Mild anterolisthesis of C4 on C5 and C5 on C6 is likely
degenerative and facet mediated. No jumped or perched facets. C1 is
well aligned on C2.

Skull base and vertebrae: No acute fracture. Vertebral body heights
are maintained. The dens and skull base are intact.

Soft tissues and spinal canal: No prevertebral fluid or swelling. No
visible canal hematoma.

Disc levels: Mild for age disc space narrowing and endplate
spurring, most prominent at C5-C6 and C6-C7. Moderate multilevel
facet arthropathy.

Upper chest: No acute finding.

Other: None.
IMPRESSION: 1. Midline frontal scalp hematoma. No acute intracranial
abnormality. No skull fracture.
2. Slight progression in generalized atrophy since 5952. Chronic
small vessel ischemia similar to prior.
3. Multilevel degenerative change in the cervical spine without
acute fracture or subluxation.
